# Patient Record
Sex: Female | Born: 1975 | Race: White | Hispanic: No | Marital: Married | Smoking: Former smoker
Health system: Southern US, Community
[De-identification: ages and names within clinical notes are randomized; demographics above are authoritative.]

## PROBLEM LIST (undated history)

## (undated) DIAGNOSIS — K219 Gastro-esophageal reflux disease without esophagitis: Secondary | ICD-10-CM

## (undated) DIAGNOSIS — F329 Major depressive disorder, single episode, unspecified: Secondary | ICD-10-CM

## (undated) DIAGNOSIS — G4730 Sleep apnea, unspecified: Secondary | ICD-10-CM

## (undated) DIAGNOSIS — F32A Depression, unspecified: Secondary | ICD-10-CM

## (undated) DIAGNOSIS — I499 Cardiac arrhythmia, unspecified: Secondary | ICD-10-CM

## (undated) DIAGNOSIS — J45909 Unspecified asthma, uncomplicated: Secondary | ICD-10-CM

## (undated) DIAGNOSIS — N809 Endometriosis, unspecified: Secondary | ICD-10-CM

## (undated) DIAGNOSIS — K7581 Nonalcoholic steatohepatitis (NASH): Secondary | ICD-10-CM

## (undated) DIAGNOSIS — G43909 Migraine, unspecified, not intractable, without status migrainosus: Secondary | ICD-10-CM

## (undated) DIAGNOSIS — M797 Fibromyalgia: Secondary | ICD-10-CM

## (undated) DIAGNOSIS — E7800 Pure hypercholesterolemia, unspecified: Secondary | ICD-10-CM

## (undated) DIAGNOSIS — I10 Essential (primary) hypertension: Secondary | ICD-10-CM

## (undated) HISTORY — PX: GASTRIC BYPASS: SHX52

---

## 2007-08-31 HISTORY — PX: BREAST BIOPSY: SHX20

## 2007-08-31 HISTORY — PX: BREAST EXCISIONAL BIOPSY: SUR124

## 2010-08-30 HISTORY — PX: TOTAL ABDOMINAL HYSTERECTOMY: SHX209

## 2010-09-04 ENCOUNTER — Ambulatory Visit: Payer: Self-pay | Admitting: Internal Medicine

## 2010-09-28 ENCOUNTER — Ambulatory Visit: Payer: Self-pay | Admitting: Oncology

## 2010-09-28 ENCOUNTER — Ambulatory Visit: Payer: Self-pay | Admitting: Internal Medicine

## 2010-09-30 ENCOUNTER — Ambulatory Visit: Payer: Self-pay | Admitting: Internal Medicine

## 2010-10-29 ENCOUNTER — Ambulatory Visit: Payer: Self-pay | Admitting: Internal Medicine

## 2010-11-10 ENCOUNTER — Emergency Department: Payer: Self-pay | Admitting: Unknown Physician Specialty

## 2010-11-26 ENCOUNTER — Ambulatory Visit: Payer: Self-pay | Admitting: Obstetrics & Gynecology

## 2010-12-08 ENCOUNTER — Ambulatory Visit: Payer: Self-pay | Admitting: Obstetrics & Gynecology

## 2011-01-20 ENCOUNTER — Ambulatory Visit: Payer: Self-pay | Admitting: Otolaryngology

## 2011-06-29 ENCOUNTER — Ambulatory Visit: Payer: Self-pay | Admitting: Internal Medicine

## 2011-08-25 ENCOUNTER — Ambulatory Visit: Payer: Self-pay | Admitting: Internal Medicine

## 2011-08-25 ENCOUNTER — Ambulatory Visit: Payer: Self-pay | Admitting: Gastroenterology

## 2011-10-21 ENCOUNTER — Ambulatory Visit: Payer: Self-pay | Admitting: Internal Medicine

## 2011-10-21 LAB — CBC CANCER CENTER
Basophil #: 0.1 x10 3/mm (ref 0.0–0.1)
Eosinophil #: 0.3 x10 3/mm (ref 0.0–0.7)
HCT: 44.8 % (ref 35.0–47.0)
Lymphocyte #: 4.9 x10 3/mm — ABNORMAL HIGH (ref 1.0–3.6)
Lymphocyte %: 38.5 %
MCHC: 33.1 g/dL (ref 32.0–36.0)
MCV: 91.4 fL (ref 80–100)
Monocyte %: 5 %
Neutrophil %: 53.3 %
RBC: 4.9 10*6/uL (ref 3.80–5.20)
RDW: 12.9 % (ref 11.5–14.5)

## 2011-10-29 ENCOUNTER — Ambulatory Visit: Payer: Self-pay | Admitting: Internal Medicine

## 2012-01-28 ENCOUNTER — Ambulatory Visit: Payer: Self-pay | Admitting: Gastroenterology

## 2012-01-28 LAB — CREATININE, SERUM
EGFR (African American): >60
EGFR (Non-African Amer.): >60

## 2012-03-06 ENCOUNTER — Ambulatory Visit: Payer: Self-pay | Admitting: Emergency Medicine

## 2012-03-09 LAB — BETA STREP CULTURE(ARMC)

## 2012-04-13 ENCOUNTER — Ambulatory Visit: Payer: Self-pay

## 2012-04-15 ENCOUNTER — Ambulatory Visit: Payer: Self-pay

## 2012-04-15 LAB — BASIC METABOLIC PANEL
Anion Gap: 9 (ref 7–16)
Calcium, Total: 9.2 mg/dL (ref 8.5–10.1)
Co2: 27 mmol/L (ref 21–32)
Creatinine: 0.77 mg/dL (ref 0.60–1.30)
EGFR (African American): >60
Osmolality: 273 (ref 275–301)
Sodium: 138 mmol/L (ref 136–145)

## 2012-04-15 LAB — CBC WITH DIFFERENTIAL/PLATELET
Basophil #: 0.1 10*3/uL (ref 0.0–0.1)
Basophil %: 0.9 %
Eosinophil %: 6.3 %
HCT: 43.3 % (ref 35.0–47.0)
HGB: 14.4 g/dL (ref 12.0–16.0)
Lymphocyte #: 3.3 10*3/uL (ref 1.0–3.6)
Lymphocyte %: 34.2 %
MCH: 30.8 pg (ref 26.0–34.0)
MCV: 92 fL (ref 80–100)
Monocyte #: 1 x10 3/mm — ABNORMAL HIGH (ref 0.2–0.9)
Neutrophil #: 4.6 10*3/uL (ref 1.4–6.5)
Platelet: 296 10*3/uL (ref 150–440)
RDW: 13.3 % (ref 11.5–14.5)
WBC: 9.6 10*3/uL (ref 3.6–11.0)

## 2012-07-05 ENCOUNTER — Ambulatory Visit: Payer: Self-pay | Admitting: Pain Medicine

## 2012-07-05 LAB — SEDIMENTATION RATE: Erythrocyte Sed Rate: 18 mm/hr (ref 0–20)

## 2012-07-11 ENCOUNTER — Ambulatory Visit: Payer: Self-pay | Admitting: Pain Medicine

## 2013-01-11 ENCOUNTER — Emergency Department: Payer: Self-pay | Admitting: Emergency Medicine

## 2013-01-11 LAB — CBC
HCT: 40.2 % (ref 35.0–47.0)
MCH: 29.8 pg (ref 26.0–34.0)
MCHC: 33.6 g/dL (ref 32.0–36.0)
MCV: 89 fL (ref 80–100)
RBC: 4.53 10*6/uL (ref 3.80–5.20)
RDW: 13.7 % (ref 11.5–14.5)
WBC: 13.8 10*3/uL — ABNORMAL HIGH (ref 3.6–11.0)

## 2013-01-11 LAB — COMPREHENSIVE METABOLIC PANEL
Alkaline Phosphatase: 123 U/L (ref 50–136)
Anion Gap: 6 — ABNORMAL LOW (ref 7–16)
BUN: 13 mg/dL (ref 7–18)
Calcium, Total: 9.9 mg/dL (ref 8.5–10.1)
Co2: 25 mmol/L (ref 21–32)
EGFR (African American): >60
EGFR (Non-African Amer.): >60
Glucose: 113 mg/dL — ABNORMAL HIGH (ref 65–99)
Osmolality: 278 (ref 275–301)
Potassium: 3.7 mmol/L (ref 3.5–5.1)
SGOT(AST): 39 U/L — ABNORMAL HIGH (ref 15–37)
Sodium: 139 mmol/L (ref 136–145)

## 2013-04-13 DIAGNOSIS — R609 Edema, unspecified: Secondary | ICD-10-CM | POA: Insufficient documentation

## 2013-07-02 DIAGNOSIS — F419 Anxiety disorder, unspecified: Secondary | ICD-10-CM | POA: Insufficient documentation

## 2013-11-19 DIAGNOSIS — F32A Depression, unspecified: Secondary | ICD-10-CM | POA: Insufficient documentation

## 2013-11-20 DIAGNOSIS — E785 Hyperlipidemia, unspecified: Secondary | ICD-10-CM | POA: Insufficient documentation

## 2013-12-11 IMAGING — CR PELVIS - 1-2 VIEW
1 series · 1 of 1 positions shown · non-contrast
Comparison: none

REASON FOR EXAM: myalgias and arthraigias neck pain upper back pain
lumbar radiculitis hip pain
COMMENTS:  knee pain

PROCEDURE:     DXR - DXR PELVIS AP ONLY  - July 05, 2012 [DATE]
RESULT:     Single view the pelvis demonstrates no fracture, dislocation or
radiopaque foreign body. Multiple phleboliths appear present over the
bladder region.

[t pelvis ap]
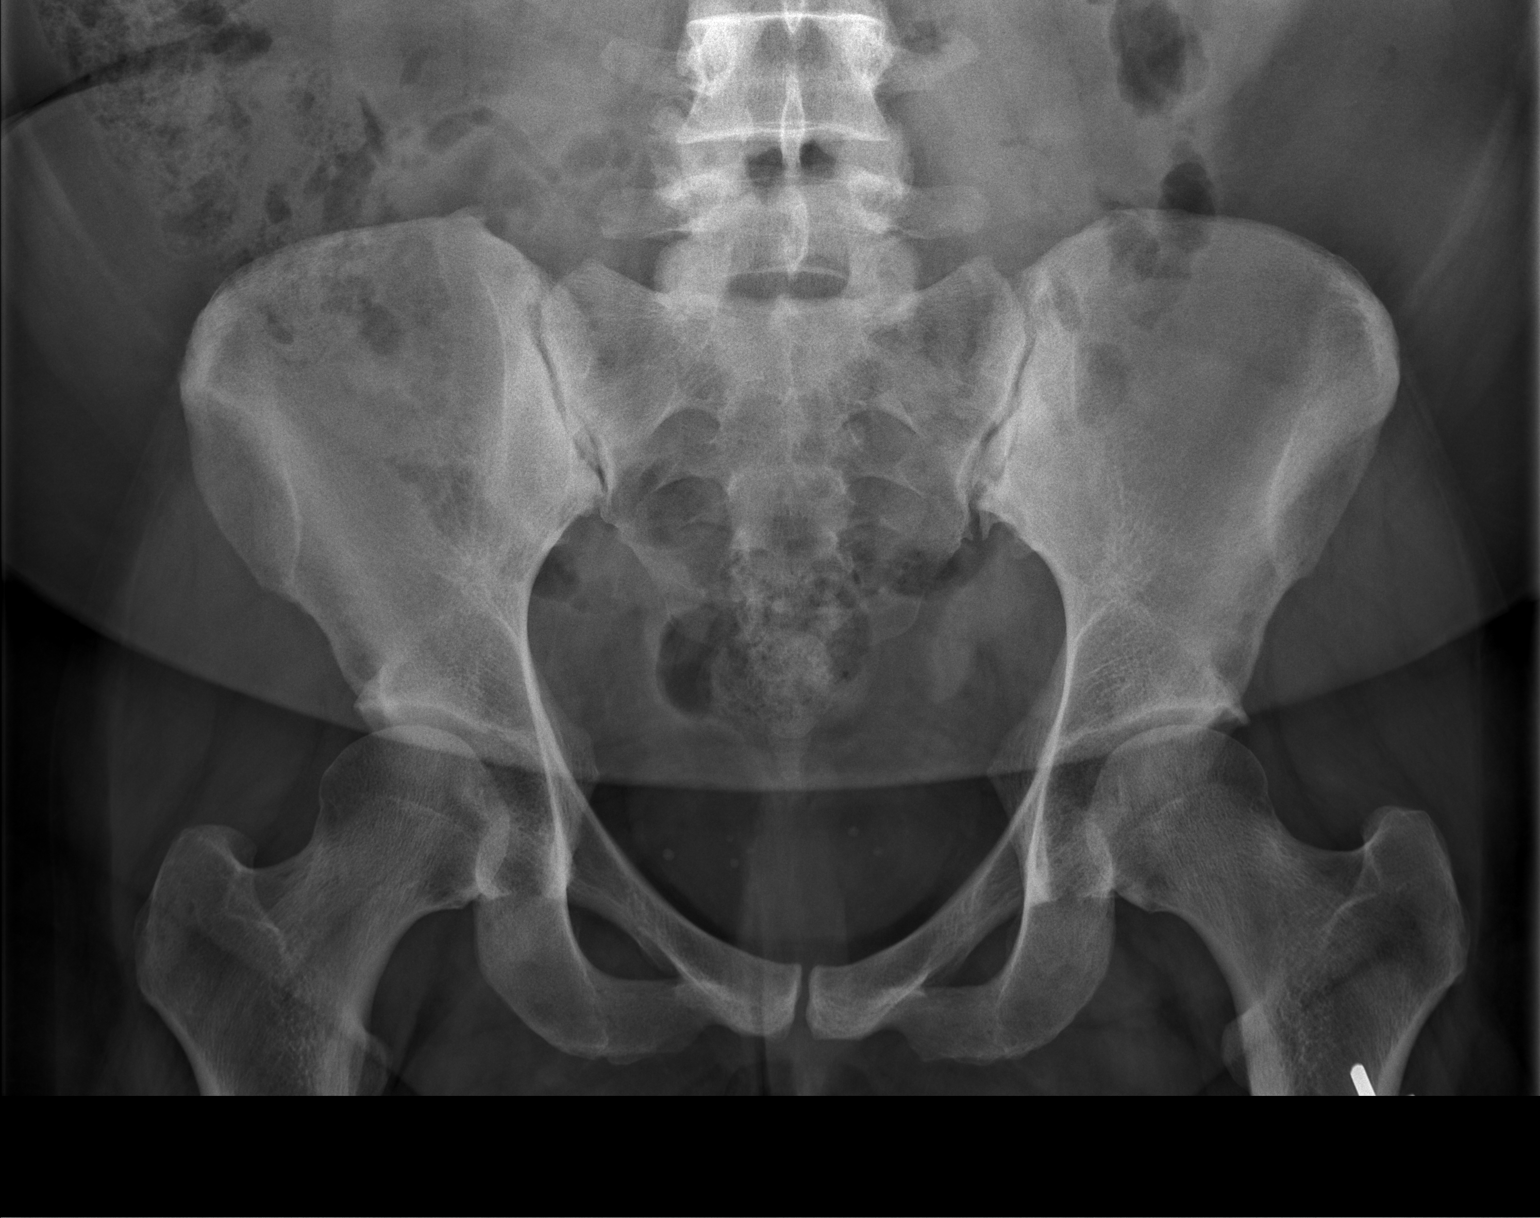

[1 of 1 positions shown; findings below may reference images not displayed]

IMPRESSION: 1. No acute bony abnormality evident.

[REDACTED]

## 2013-12-11 IMAGING — CR DG THORACIC SPINE 2-3V
1 series · 2 of 2 positions shown · non-contrast
Comparison: none

REASON FOR EXAM: myalgias and arthraigias neck pain upper back pain
lumbar radiculitis hip pain
COMMENTS:  knee pain

PROCEDURE:     DXR - DXR THORACIC  AP AND LATERAL  - July 05, 2012 [DATE]
RESULT:     Thoracic spine AP and lateral projection images show
preservation of the vertebral body heights and spinal alignment. The disc
spaces appear to be unremarkable.

[Series 1: t thoracic spine ap · 0.14mm/px · 2 of 2 slices shown]
[im 1/2]
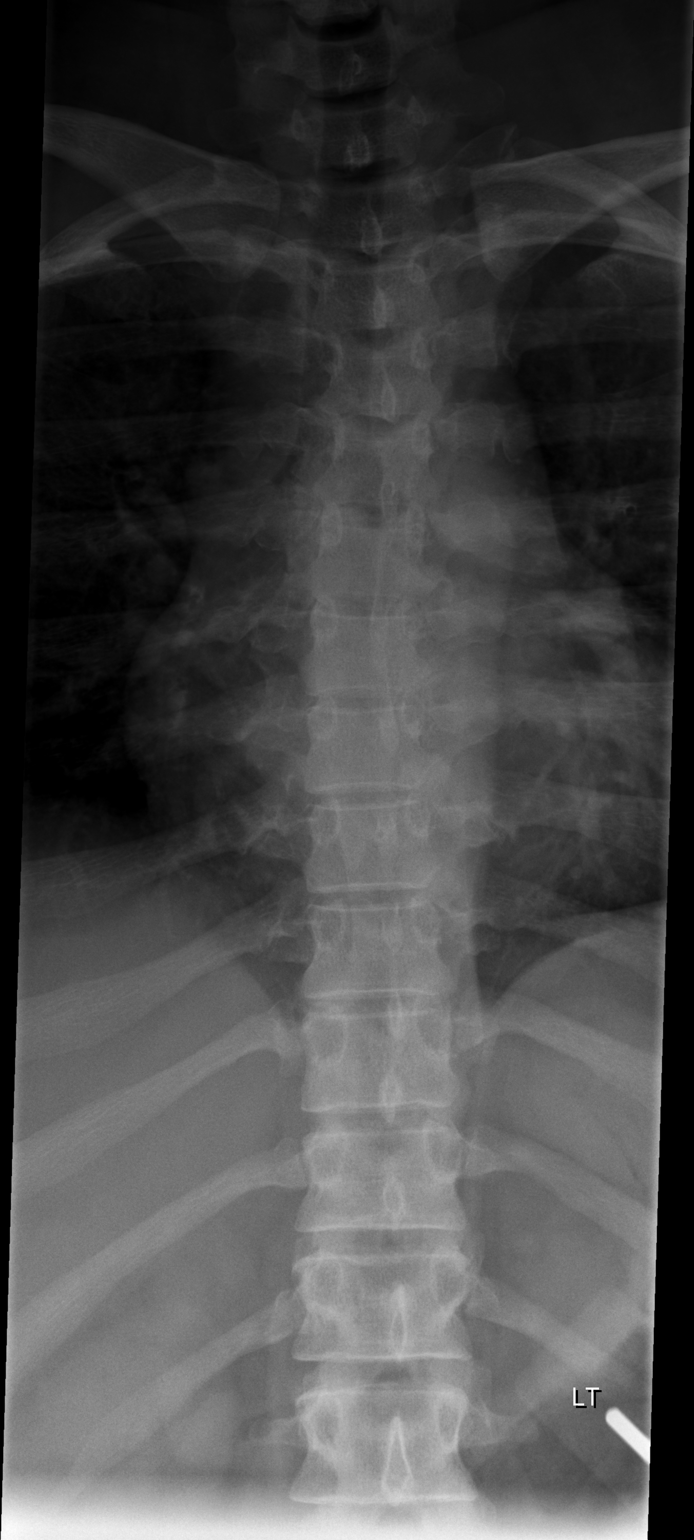
[im 2/2]
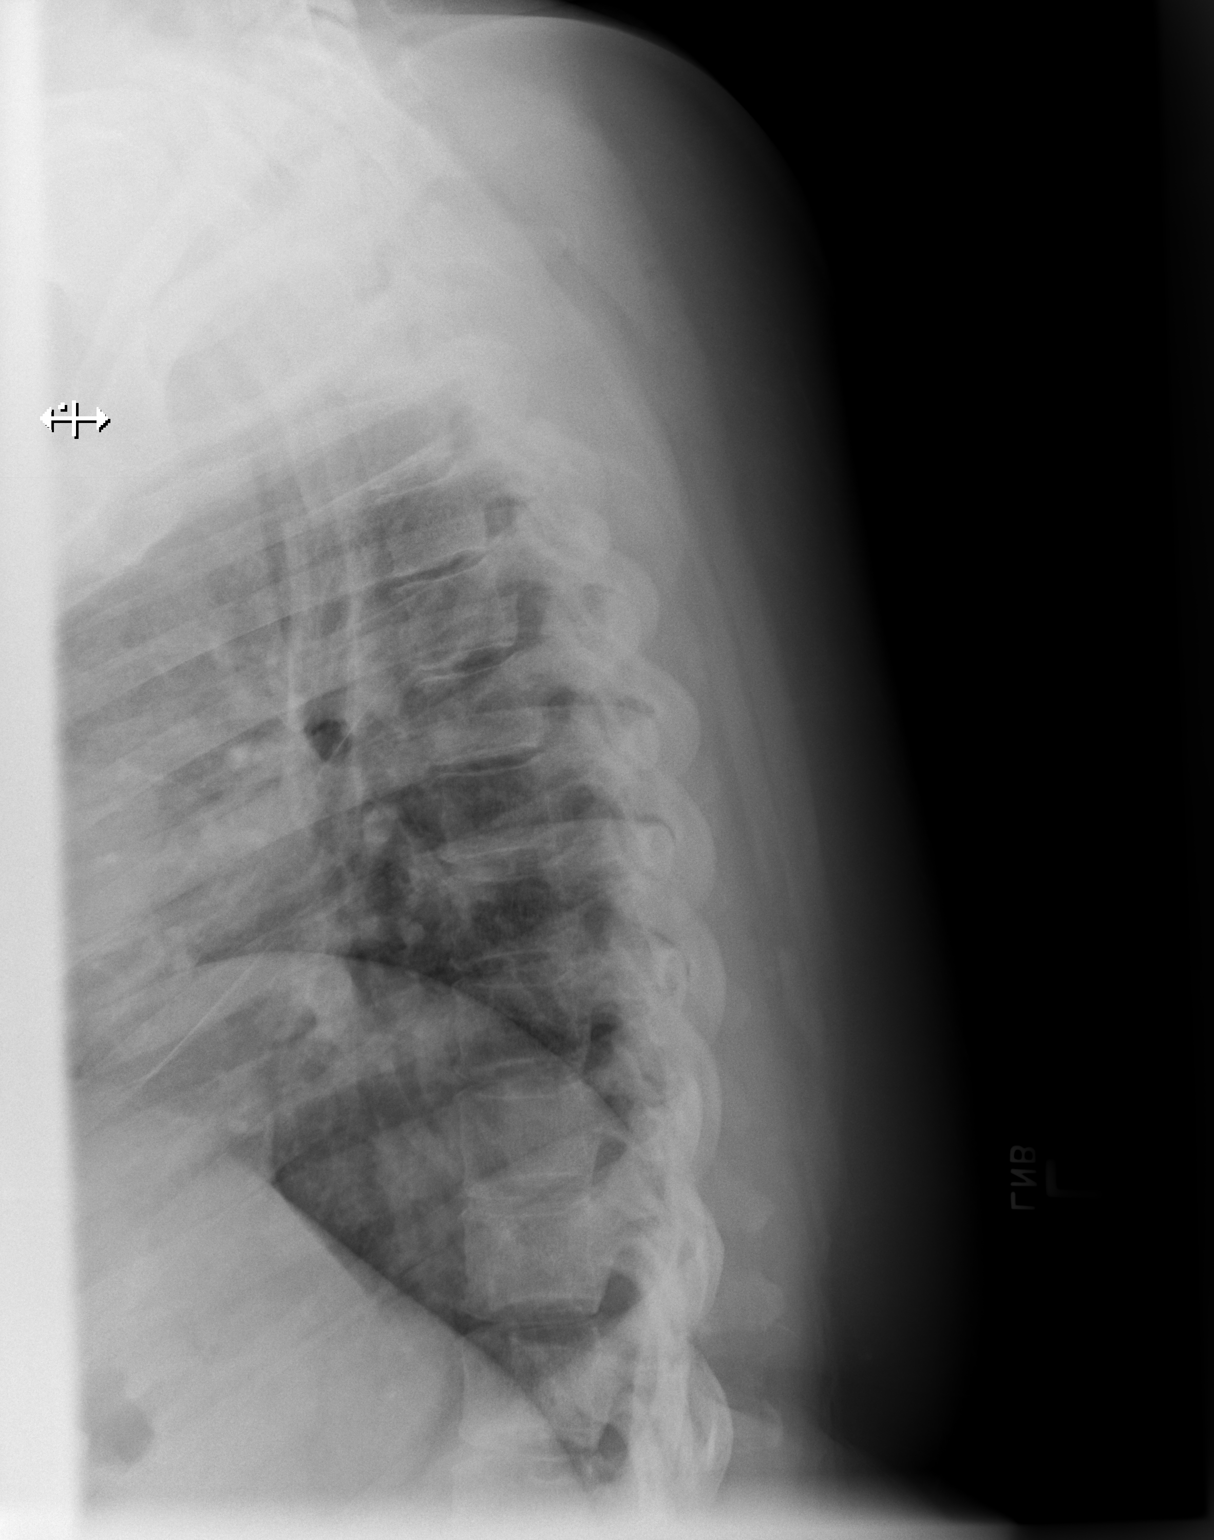

[2 of 2 positions shown; findings below may reference images not displayed]

IMPRESSION: No acute thoracic spine bony abnormality.

[REDACTED]

## 2013-12-11 IMAGING — CR CERVICAL SPINE - COMPLETE 4+ VIEW
1 series · 6 of 6 positions shown · non-contrast
Comparison: none

REASON FOR EXAM: myalgias and arthraigias neck pain upper back pain
lumbar radiculitis hip pain
COMMENTS:  knee pain

[Series 1: w cervical spine lat · 0.14mm/px · 6 of 6 slices shown]
[im 1/6]
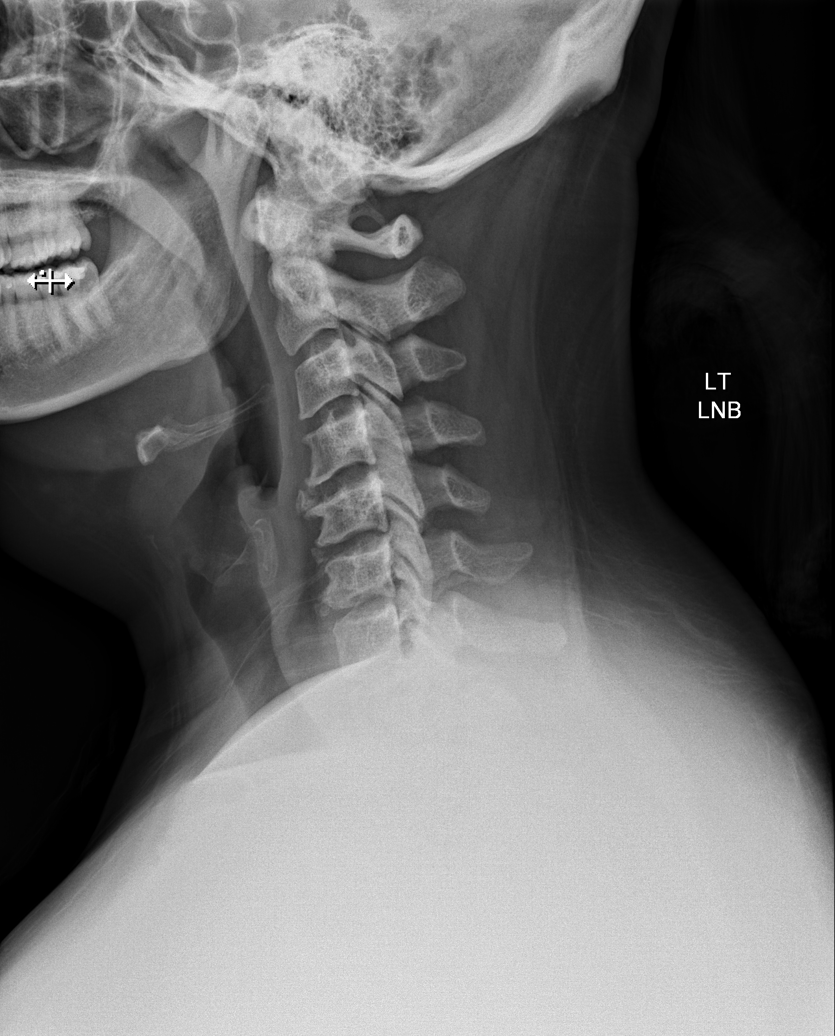
[im 2/6]
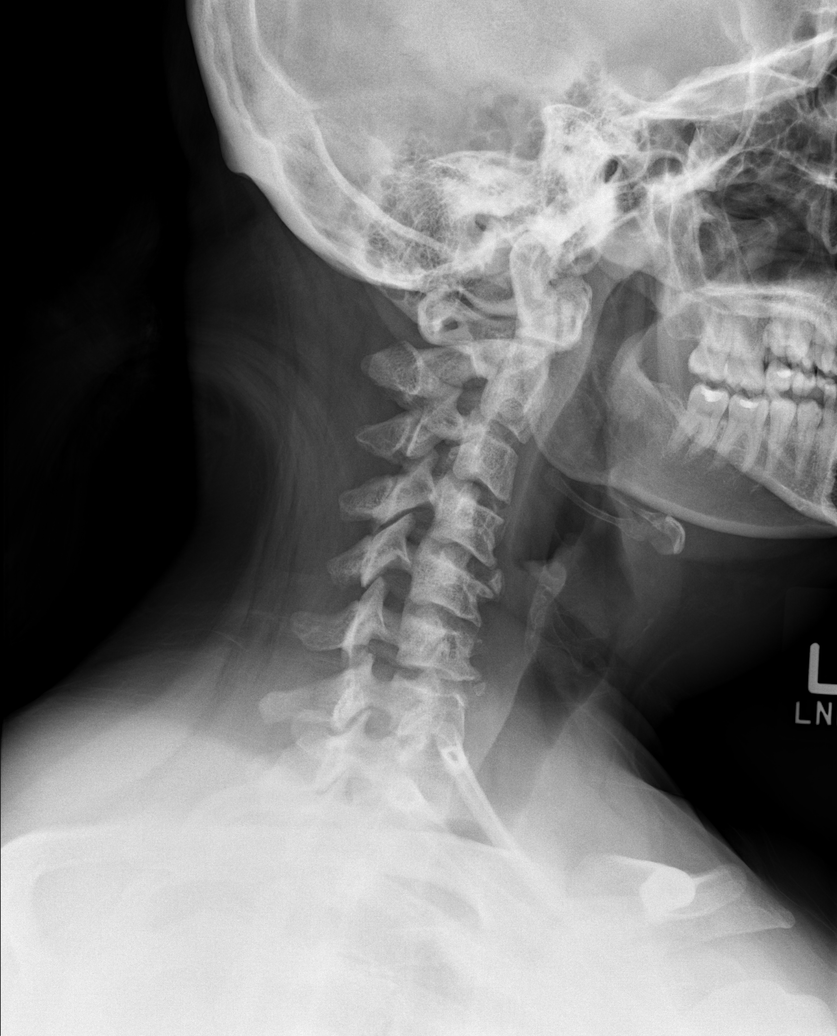
[im 3/6]
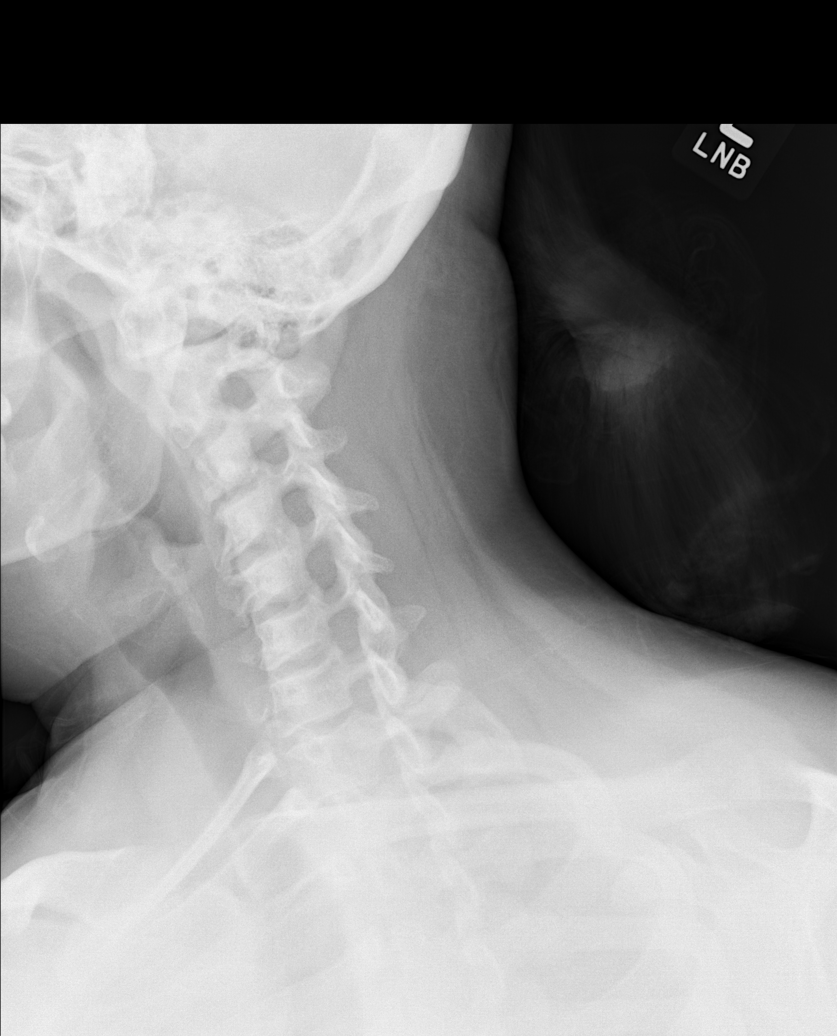
[im 4/6]
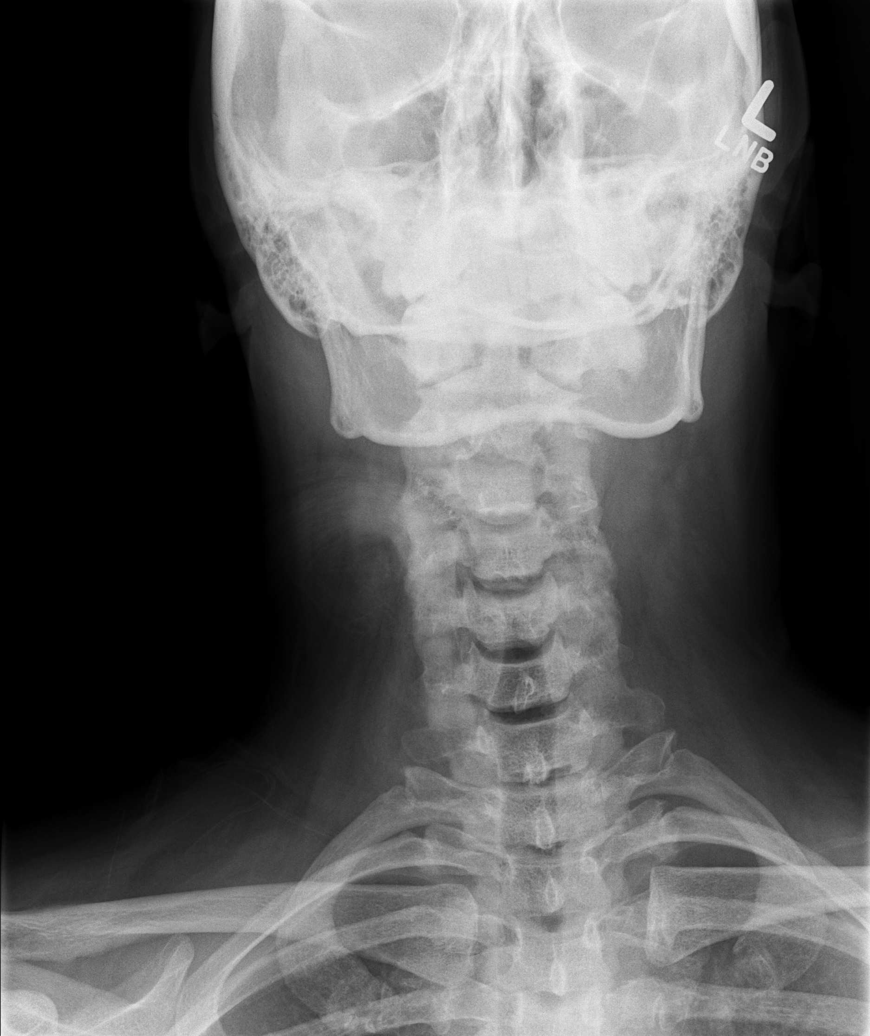
[im 5/6]
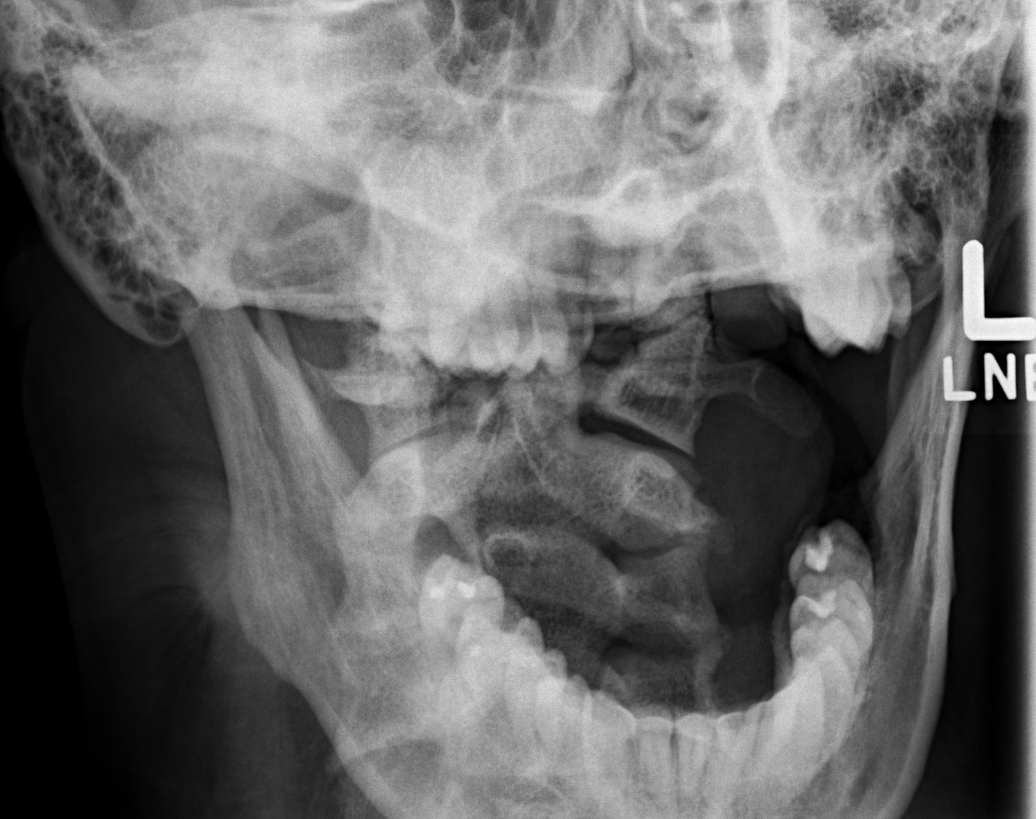
[im 6/6]
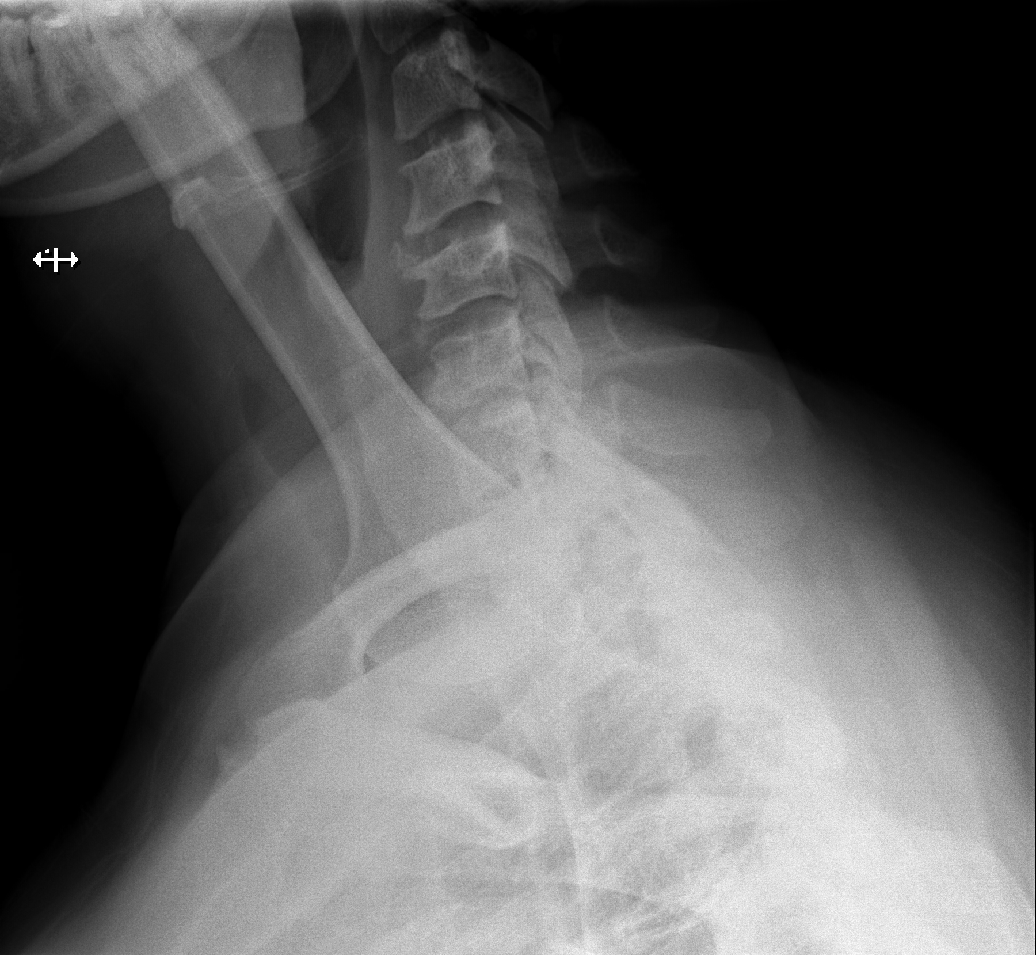

[6 of 6 positions shown; findings below may reference images not displayed]

PROCEDURE:     DXR - DXR CERVICAL SPINE COMPLETE  - July 05, 2012 [DATE]

RESULT:     There is reversal of the normal cervical lordosis. There is
multilevel degenerative disc space narrowing with hypertrophic degenerative
endplate spurring. There is no fracture or subluxation. No significant bony
encroachment on the foramina is appreciated. The odontoid is not well
demonstrated. The head is rotated toward the left. The atlantoaxial
alignment appears to be grossly normal for the projection.
IMPRESSION: Limited study with reversal of the normal cervical
lordosis. This could be secondary to muscle spasm. No fracture evident.

[REDACTED]

## 2013-12-11 IMAGING — CR DG KNEE STANDING AP BILAT
1 series · 1 of 1 positions shown · non-contrast
Comparison: none

REASON FOR EXAM: myalgias and arthraigias neck pain upper back pain
lumbar radiculitis hip pain
COMMENTS:  knee pain

PROCEDURE:     DXR - DXR BILATERAL KNEES STANDING  - July 05, 2012 [DATE]
RESULT:     AP standing views of the knees in the anterior posterior
projection show normal alignment without fracture or dislocation. No foreign
body is seen. There does not appear to be severe joint space narrowing.

[w knees ap bilat]
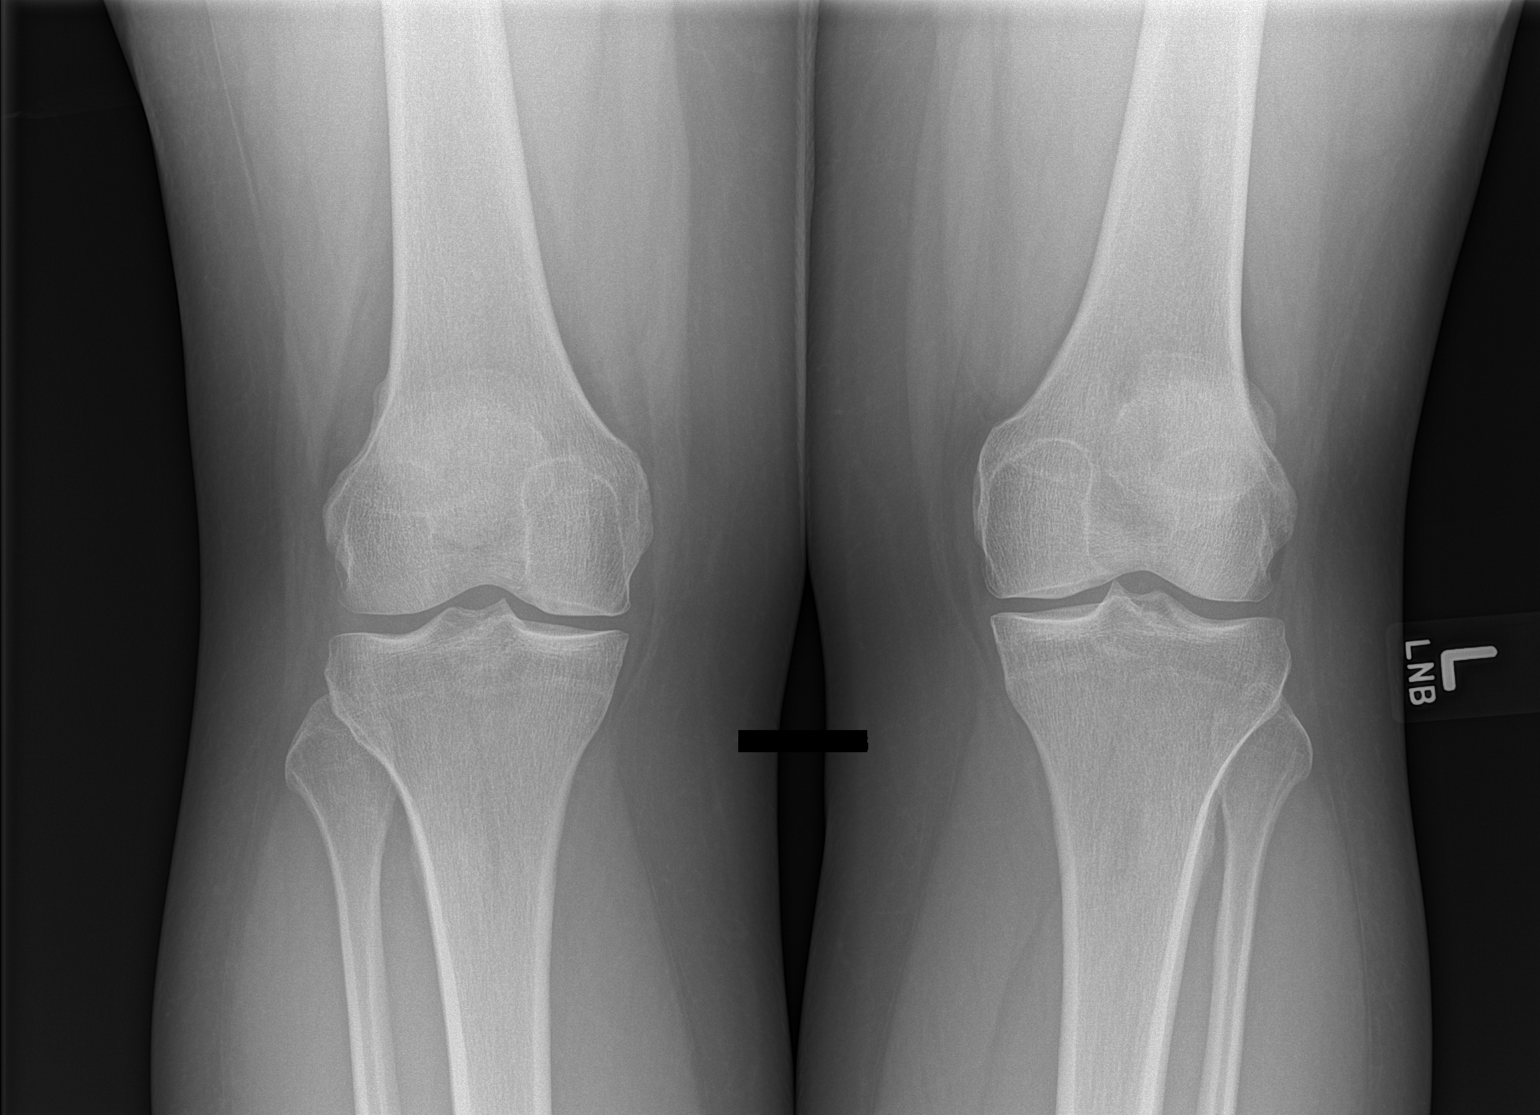

[1 of 1 positions shown; findings below may reference images not displayed]

IMPRESSION: Please see above.

[REDACTED]

## 2014-01-08 ENCOUNTER — Ambulatory Visit: Payer: Self-pay | Admitting: Emergency Medicine

## 2014-02-19 ENCOUNTER — Ambulatory Visit: Payer: Self-pay | Admitting: Internal Medicine

## 2014-02-25 ENCOUNTER — Observation Stay: Payer: Self-pay | Admitting: Surgery

## 2014-02-25 LAB — CBC WITH DIFFERENTIAL/PLATELET
Basophil #: 0.1 10*3/uL (ref 0.0–0.1)
Basophil %: 0.8 %
Eosinophil #: 0.4 10*3/uL (ref 0.0–0.7)
Eosinophil %: 2.6 %
HCT: 40.4 % (ref 35.0–47.0)
HGB: 13.4 g/dL (ref 12.0–16.0)
LYMPHS ABS: 5.3 10*3/uL — AB (ref 1.0–3.6)
Lymphocyte %: 38.4 %
MCH: 30.4 pg (ref 26.0–34.0)
MCHC: 33.1 g/dL (ref 32.0–36.0)
MCV: 92 fL (ref 80–100)
Monocyte #: 0.8 x10 3/mm (ref 0.2–0.9)
Monocyte %: 6.1 %
NEUTROS PCT: 52.1 %
Neutrophil #: 7.2 10*3/uL — ABNORMAL HIGH (ref 1.4–6.5)
Platelet: 322 10*3/uL (ref 150–440)
RBC: 4.41 10*6/uL (ref 3.80–5.20)
RDW: 13.5 % (ref 11.5–14.5)
WBC: 13.8 10*3/uL — AB (ref 3.6–11.0)

## 2014-02-25 LAB — COMPREHENSIVE METABOLIC PANEL
ALT: 44 U/L (ref 12–78)
AST: 26 U/L (ref 15–37)
Albumin: 3.3 g/dL — ABNORMAL LOW (ref 3.4–5.0)
Alkaline Phosphatase: 77 U/L
Anion Gap: 5 — ABNORMAL LOW (ref 7–16)
BUN: 15 mg/dL (ref 7–18)
Bilirubin,Total: 0.2 mg/dL (ref 0.2–1.0)
CREATININE: 1.22 mg/dL (ref 0.60–1.30)
Calcium, Total: 9.5 mg/dL (ref 8.5–10.1)
Chloride: 102 mmol/L (ref 98–107)
Co2: 33 mmol/L — ABNORMAL HIGH (ref 21–32)
EGFR (Non-African Amer.): 57 — ABNORMAL LOW
Glucose: 84 mg/dL (ref 65–99)
Osmolality: 279 (ref 275–301)
Potassium: 3.4 mmol/L — ABNORMAL LOW (ref 3.5–5.1)
Sodium: 140 mmol/L (ref 136–145)
Total Protein: 7.4 g/dL (ref 6.4–8.2)

## 2014-02-26 LAB — COMPREHENSIVE METABOLIC PANEL
Albumin: 3 g/dL — ABNORMAL LOW (ref 3.4–5.0)
Alkaline Phosphatase: 62 U/L
Anion Gap: 9 (ref 7–16)
BUN: 13 mg/dL (ref 7–18)
Bilirubin,Total: 0.3 mg/dL (ref 0.2–1.0)
Calcium, Total: 8.8 mg/dL (ref 8.5–10.1)
Chloride: 104 mmol/L (ref 98–107)
Co2: 30 mmol/L (ref 21–32)
Creatinine: 1.07 mg/dL (ref 0.60–1.30)
EGFR (African American): >60
Glucose: 97 mg/dL (ref 65–99)
Osmolality: 285 (ref 275–301)
POTASSIUM: 3.5 mmol/L (ref 3.5–5.1)
SGOT(AST): 24 U/L (ref 15–37)
SGPT (ALT): 45 U/L (ref 12–78)
SODIUM: 143 mmol/L (ref 136–145)
Total Protein: 6.5 g/dL (ref 6.4–8.2)

## 2014-02-26 LAB — CBC WITH DIFFERENTIAL/PLATELET
BASOS ABS: 0.1 10*3/uL (ref 0.0–0.1)
BASOS PCT: 0.7 %
EOS PCT: 4 %
Eosinophil #: 0.5 10*3/uL (ref 0.0–0.7)
HCT: 37.5 % (ref 35.0–47.0)
HGB: 11.9 g/dL — ABNORMAL LOW (ref 12.0–16.0)
Lymphocyte #: 5.1 10*3/uL — ABNORMAL HIGH (ref 1.0–3.6)
Lymphocyte %: 43.3 %
MCH: 29.6 pg (ref 26.0–34.0)
MCHC: 31.8 g/dL — ABNORMAL LOW (ref 32.0–36.0)
MCV: 93 fL (ref 80–100)
MONO ABS: 0.7 x10 3/mm (ref 0.2–0.9)
MONOS PCT: 5.8 %
NEUTROS ABS: 5.4 10*3/uL (ref 1.4–6.5)
NEUTROS PCT: 46.2 %
Platelet: 303 10*3/uL (ref 150–440)
RBC: 4.03 10*6/uL (ref 3.80–5.20)
RDW: 13.4 % (ref 11.5–14.5)
WBC: 11.7 10*3/uL — ABNORMAL HIGH (ref 3.6–11.0)

## 2014-02-27 LAB — CBC WITH DIFFERENTIAL/PLATELET
Basophil #: 0.2 10*3/uL — ABNORMAL HIGH (ref 0.0–0.1)
Basophil %: 0.9 %
Eosinophil #: 0.1 10*3/uL (ref 0.0–0.7)
Eosinophil %: 0.3 %
HCT: 36.5 % (ref 35.0–47.0)
HGB: 12.2 g/dL (ref 12.0–16.0)
LYMPHS PCT: 17.1 %
Lymphocyte #: 3.2 10*3/uL (ref 1.0–3.6)
MCH: 30.5 pg (ref 26.0–34.0)
MCHC: 33.5 g/dL (ref 32.0–36.0)
MCV: 91 fL (ref 80–100)
MONO ABS: 0.9 x10 3/mm (ref 0.2–0.9)
Monocyte %: 4.6 %
Neutrophil #: 14.6 10*3/uL — ABNORMAL HIGH (ref 1.4–6.5)
Neutrophil %: 77.1 %
Platelet: 301 10*3/uL (ref 150–440)
RBC: 4.01 10*6/uL (ref 3.80–5.20)
RDW: 13.3 % (ref 11.5–14.5)
WBC: 18.9 10*3/uL — ABNORMAL HIGH (ref 3.6–11.0)

## 2014-02-27 LAB — COMPREHENSIVE METABOLIC PANEL
ALBUMIN: 3.2 g/dL — AB (ref 3.4–5.0)
ALT: 76 U/L (ref 12–78)
AST: 42 U/L — AB (ref 15–37)
Alkaline Phosphatase: 63 U/L
Anion Gap: 7 (ref 7–16)
BUN: 11 mg/dL (ref 7–18)
Bilirubin,Total: 0.4 mg/dL (ref 0.2–1.0)
CHLORIDE: 103 mmol/L (ref 98–107)
CO2: 29 mmol/L (ref 21–32)
CREATININE: 1.01 mg/dL (ref 0.60–1.30)
Calcium, Total: 9.1 mg/dL (ref 8.5–10.1)
EGFR (African American): >60
GLUCOSE: 111 mg/dL — AB (ref 65–99)
Osmolality: 278 (ref 275–301)
Potassium: 3.9 mmol/L (ref 3.5–5.1)
Sodium: 139 mmol/L (ref 136–145)
Total Protein: 6.9 g/dL (ref 6.4–8.2)

## 2014-02-28 LAB — PATHOLOGY REPORT

## 2014-03-06 ENCOUNTER — Other Ambulatory Visit: Payer: Self-pay | Admitting: Surgery

## 2014-03-06 LAB — CBC WITH DIFFERENTIAL/PLATELET
BASOS ABS: 0.1 10*3/uL (ref 0.0–0.1)
BASOS PCT: 0.9 %
Eosinophil #: 1.1 10*3/uL — ABNORMAL HIGH (ref 0.0–0.7)
Eosinophil %: 8.2 %
HCT: 41.9 % (ref 35.0–47.0)
HGB: 13.6 g/dL (ref 12.0–16.0)
LYMPHS PCT: 27.3 %
Lymphocyte #: 3.8 10*3/uL — ABNORMAL HIGH (ref 1.0–3.6)
MCH: 29.8 pg (ref 26.0–34.0)
MCHC: 32.3 g/dL (ref 32.0–36.0)
MCV: 92 fL (ref 80–100)
Monocyte #: 0.7 x10 3/mm (ref 0.2–0.9)
Monocyte %: 4.8 %
NEUTROS PCT: 58.8 %
Neutrophil #: 8.2 10*3/uL — ABNORMAL HIGH (ref 1.4–6.5)
Platelet: 330 10*3/uL (ref 150–440)
RBC: 4.55 10*6/uL (ref 3.80–5.20)
RDW: 13.3 % (ref 11.5–14.5)
WBC: 13.9 10*3/uL — AB (ref 3.6–11.0)

## 2014-03-06 LAB — COMPREHENSIVE METABOLIC PANEL
ALT: 53 U/L (ref 12–78)
Albumin: 3.4 g/dL (ref 3.4–5.0)
Alkaline Phosphatase: 83 U/L
Anion Gap: 8 (ref 7–16)
BILIRUBIN TOTAL: 0.3 mg/dL (ref 0.2–1.0)
BUN: 17 mg/dL (ref 7–18)
CALCIUM: 9.3 mg/dL (ref 8.5–10.1)
Chloride: 102 mmol/L (ref 98–107)
Co2: 29 mmol/L (ref 21–32)
Creatinine: 1.23 mg/dL (ref 0.60–1.30)
EGFR (Non-African Amer.): 57 — ABNORMAL LOW
Glucose: 99 mg/dL (ref 65–99)
OSMOLALITY: 279 (ref 275–301)
Potassium: 3.7 mmol/L (ref 3.5–5.1)
SGOT(AST): 19 U/L (ref 15–37)
Sodium: 139 mmol/L (ref 136–145)
Total Protein: 7.8 g/dL (ref 6.4–8.2)

## 2014-05-23 ENCOUNTER — Ambulatory Visit: Payer: Self-pay | Admitting: Internal Medicine

## 2014-05-30 ENCOUNTER — Ambulatory Visit: Payer: Self-pay | Admitting: Internal Medicine

## 2014-05-31 LAB — CBC CANCER CENTER
BANDS NEUTROPHIL: 2 %
Eosinophil: 4 %
HCT: 42.2 % (ref 35.0–47.0)
HGB: 13.8 g/dL (ref 12.0–16.0)
Lymphocytes: 35 %
MCH: 30.7 pg (ref 26.0–34.0)
MCHC: 32.6 g/dL (ref 32.0–36.0)
MCV: 94 fL (ref 80–100)
Monocytes: 2 %
Platelet: 348 x10 3/mm (ref 150–440)
RBC: 4.49 10*6/uL (ref 3.80–5.20)
RDW: 14.3 % (ref 11.5–14.5)
Segmented Neutrophils: 56 %
Variant Lymphocyte: 1 %
WBC: 15 x10 3/mm — ABNORMAL HIGH (ref 3.6–11.0)

## 2014-06-30 ENCOUNTER — Ambulatory Visit: Payer: Self-pay | Admitting: Internal Medicine

## 2014-10-03 ENCOUNTER — Ambulatory Visit: Payer: Self-pay | Admitting: Internal Medicine

## 2014-10-31 ENCOUNTER — Ambulatory Visit: Payer: Self-pay | Admitting: Internal Medicine

## 2014-12-07 ENCOUNTER — Ambulatory Visit
Admit: 2014-12-07 | Disposition: A | Discharge status: Home / Self Care | Payer: Self-pay | Attending: Family Medicine | Admitting: Family Medicine

## 2014-12-08 ENCOUNTER — Ambulatory Visit
Admit: 2014-12-08 | Disposition: A | Discharge status: Home / Self Care | Payer: Self-pay | Attending: Family Medicine | Admitting: Family Medicine

## 2014-12-08 LAB — RAPID STREP-A WITH REFLX: MICRO TEXT REPORT: NEGATIVE

## 2014-12-10 LAB — BETA STREP CULTURE(ARMC)

## 2014-12-21 NOTE — Op Note (Signed)
PATIENT NAME:  Norma Riggs, NASH MR#:  [id number] DATE OF BIRTH:  Nov 24, 1975  DATE OF PROCEDURE:  02/26/2014  PREOPERATIVE DIAGNOSIS: Symptomatic cholelithiasis and acute cholecystitis.   POSTOPERATIVE DIAGNOSIS: Symptomatic cholelithiasis and acute cholecystitis.   PROCEDURE: Laparoscopic cholecystectomy.   SURGEON: Phoebe Perch, M.D.   ASSISTANT: Silvestre Moment, PA-S  INDICATIONS: This is a patient with unrelenting right upper quadrant pain and work-up previously showing gallstones. Liver function tests are normal. There is no sign of choledocholithiasis.   Preoperatively, we discussed rationale for surgery, the options of observation, risk of bleeding, infection, recurrence of symptoms, failure to resolve her symptoms, open procedure, bile duct damage, bile duct leak, and retained common bile duct stone any of which could require further surgery and/or ERCP, stent, and papillotomy. This was reviewed for her in the preop area as well. She understood and agreed to proceed.   FINDINGS: Acute cholecystitis, small gallstones, normal cystic duct.   DESCRIPTION OF PROCEDURE: The patient was induced to general anesthesia. She was on IV antibiotics. VTE prophylaxis was in place. She was prepped and draped in sterile fashion. Marcaine was infiltrated in the skin and subcutaneous tissues around the periumbilical area. Incision was made. Veress needle was placed. Pneumoperitoneum was obtained, and the abdominal cavity was explored. Under direct vision, a 10 mm epigastric port and two lateral 5 mm ports were placed adjunct to a 5 mm port that had been placed in the periumbilical area. The gallbladder was placed on tension. The peritoneum over the infundibulum was incised bluntly. The cystic duct gallbladder junction was well identified. The cystic artery was identified, doubly clipped and divided in 2 branches. This allowed for good visualization of the cystic duct as it entered the infundibulum. Here it was  doubly clipped and divided and the gallbladder was taken from the gallbladder fossa with electrocautery and passed out through the epigastric port site with the aid of an Endo Catch bag. The area was checked for hemostasis and irrigated with copious amounts of normal saline. There was no sign of bleeding, bile leak or bowel injury. The camera was placed in the epigastric site to view back to the periumbilical site and there was no sign of adhesions or bowel injury. Therefore, pneumoperitoneum was released. All ports were removed. Fascial edges at the epigastric site were approximated with figure-of-eight 0 Vicryls; 4-0 subcuticular Monocryl was used on all skin edges. Steri-Strips, Mastisol and sterile dressings were placed.   The patient tolerated the procedure well. There were no complications. She was taken to the recovery room in stable condition to be admitted for continued care.   ____________________________ Jerrol Banana. Burt Knack, MD rec:sb D: 02/26/2014 10:51:28 ET T: 02/26/2014 16:31:40 ET JOB#: [id number]  cc: Jerrol Banana. Burt Knack, MD, <Dictator> Florene Glen MD ELECTRONICALLY SIGNED 02/26/2014 18:47

## 2014-12-21 NOTE — Discharge Summary (Signed)
PATIENT NAME:  Norma Riggs, Norma Riggs MR#:  [id number] DATE OF BIRTH:  01-30-1976  DATE OF ADMISSION:  02/25/2014 DATE OF DISCHARGE:  02/27/2014  DIAGNOSES:  1. Acute cholecystitis.  2. Cholelithiasis.  3. Hypertension.   PROCEDURE: Laparoscopic cholecystectomy.   CONSULTANTS: None.   HISTORY OF PRESENT ILLNESS AND HOSPITAL COURSE: This is a patient admitted from the office by Dr. Marina Gravel with a diagnosis of acute cholecystitis. She was taken to the operating room where laparoscopic cholecystectomy was performed. She made an uncomplicated postoperative recovery, was discharged in stable condition to follow up in my office in 10 days.   ____________________________ Jerrol Banana Burt Knack, MD rec:dd D: 02/28/2014 16:31:36 ET T: 03/01/2014 03:03:07 ET JOB#: [id number]  cc: Jerrol Banana. Burt Knack, MD, <Dictator> Florene Glen MD ELECTRONICALLY SIGNED 03/01/2014 10:43

## 2014-12-22 ENCOUNTER — Emergency Department
Admit: 2014-12-22 | Disposition: A | Discharge status: Home / Self Care | Payer: Self-pay | Admitting: Emergency Medicine

## 2014-12-22 LAB — COMPREHENSIVE METABOLIC PANEL
AST: 23 U/L
Albumin: 3.4 g/dL — ABNORMAL LOW
Alkaline Phosphatase: 80 U/L
Anion Gap: 11 (ref 7–16)
BUN: 20 mg/dL
Bilirubin,Total: <0.1 mg/dL — ABNORMAL LOW
CALCIUM: 9.5 mg/dL
CHLORIDE: 101 mmol/L
Co2: 28 mmol/L
Creatinine: 1.13 mg/dL — ABNORMAL HIGH
EGFR (Non-African Amer.): >60
Glucose: 181 mg/dL — ABNORMAL HIGH
POTASSIUM: 3.2 mmol/L — AB
SGPT (ALT): 33 U/L
Sodium: 140 mmol/L
TOTAL PROTEIN: 7.5 g/dL

## 2014-12-22 LAB — CBC WITH DIFFERENTIAL/PLATELET
BASOS PCT: 0.7 %
Basophil #: 0.1 10*3/uL (ref 0.0–0.1)
EOS PCT: 3 %
Eosinophil #: 0.5 10*3/uL (ref 0.0–0.7)
HCT: 35.9 % (ref 35.0–47.0)
HGB: 11.9 g/dL — AB (ref 12.0–16.0)
LYMPHS ABS: 5.3 10*3/uL — AB (ref 1.0–3.6)
Lymphocyte %: 32 %
MCH: 29.7 pg (ref 26.0–34.0)
MCHC: 33.3 g/dL (ref 32.0–36.0)
MCV: 89 fL (ref 80–100)
MONO ABS: 0.8 x10 3/mm (ref 0.2–0.9)
MONOS PCT: 5.1 %
Neutrophil #: 9.7 10*3/uL — ABNORMAL HIGH (ref 1.4–6.5)
Neutrophil %: 59.2 %
PLATELETS: 352 10*3/uL (ref 150–440)
RBC: 4.02 10*6/uL (ref 3.80–5.20)
RDW: 13.2 % (ref 11.5–14.5)
WBC: 16.4 10*3/uL — ABNORMAL HIGH (ref 3.6–11.0)

## 2014-12-22 LAB — TROPONIN I: Troponin-I: <0.03 ng/mL

## 2014-12-23 LAB — LIPASE, BLOOD: Lipase: 45 U/L

## 2014-12-23 LAB — TROPONIN I

## 2015-07-12 ENCOUNTER — Encounter: Payer: Self-pay | Admitting: Internal Medicine

## 2015-07-14 ENCOUNTER — Other Ambulatory Visit: Payer: Self-pay | Admitting: Internal Medicine

## 2015-07-14 DIAGNOSIS — K279 Peptic ulcer, site unspecified, unspecified as acute or chronic, without hemorrhage or perforation: Secondary | ICD-10-CM | POA: Insufficient documentation

## 2015-07-14 DIAGNOSIS — D72829 Elevated white blood cell count, unspecified: Secondary | ICD-10-CM | POA: Insufficient documentation

## 2015-07-14 DIAGNOSIS — E782 Mixed hyperlipidemia: Secondary | ICD-10-CM | POA: Insufficient documentation

## 2015-07-14 DIAGNOSIS — G43919 Migraine, unspecified, intractable, without status migrainosus: Secondary | ICD-10-CM | POA: Insufficient documentation

## 2015-07-14 DIAGNOSIS — N6019 Diffuse cystic mastopathy of unspecified breast: Secondary | ICD-10-CM | POA: Insufficient documentation

## 2015-07-14 DIAGNOSIS — G479 Sleep disorder, unspecified: Secondary | ICD-10-CM | POA: Insufficient documentation

## 2015-07-14 DIAGNOSIS — G4700 Insomnia, unspecified: Secondary | ICD-10-CM | POA: Insufficient documentation

## 2015-07-14 DIAGNOSIS — K7689 Other specified diseases of liver: Secondary | ICD-10-CM | POA: Insufficient documentation

## 2015-07-14 DIAGNOSIS — R000 Tachycardia, unspecified: Secondary | ICD-10-CM | POA: Insufficient documentation

## 2015-07-14 DIAGNOSIS — F17201 Nicotine dependence, unspecified, in remission: Secondary | ICD-10-CM | POA: Insufficient documentation

## 2015-07-14 DIAGNOSIS — D3500 Benign neoplasm of unspecified adrenal gland: Secondary | ICD-10-CM | POA: Insufficient documentation

## 2015-07-14 DIAGNOSIS — Z86010 Personal history of colonic polyps: Secondary | ICD-10-CM | POA: Insufficient documentation

## 2015-07-14 DIAGNOSIS — M797 Fibromyalgia: Secondary | ICD-10-CM | POA: Insufficient documentation

## 2015-07-14 DIAGNOSIS — R7303 Prediabetes: Secondary | ICD-10-CM | POA: Insufficient documentation

## 2015-07-14 DIAGNOSIS — L732 Hidradenitis suppurativa: Secondary | ICD-10-CM | POA: Insufficient documentation

## 2015-07-14 DIAGNOSIS — R600 Localized edema: Secondary | ICD-10-CM | POA: Insufficient documentation

## 2015-07-28 IMAGING — US ABDOMEN ULTRASOUND
1 series · 14 of 25 positions shown · non-contrast
Comparison: None.

CLINICAL DATA: Abdominal pain.

EXAM:
ULTRASOUND ABDOMEN COMPLETE

[Series 1: abdomen ultrasound · 0.33mm/px · 14 of 97 slices shown]
[im 1/97]
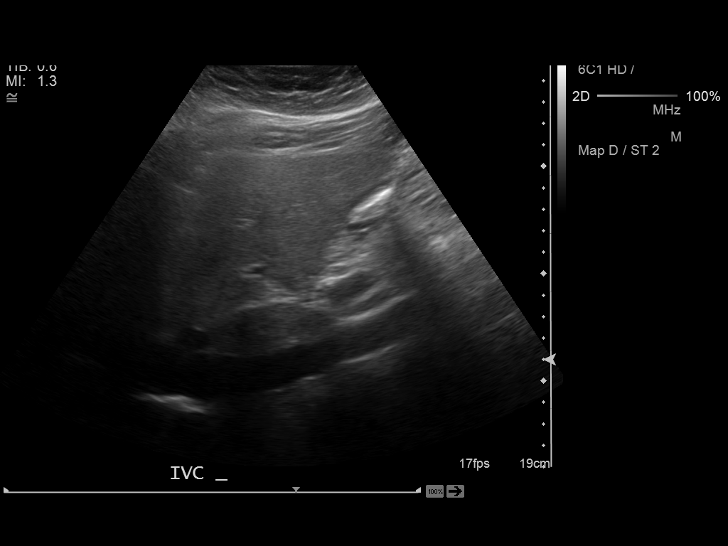
[im 9/97]
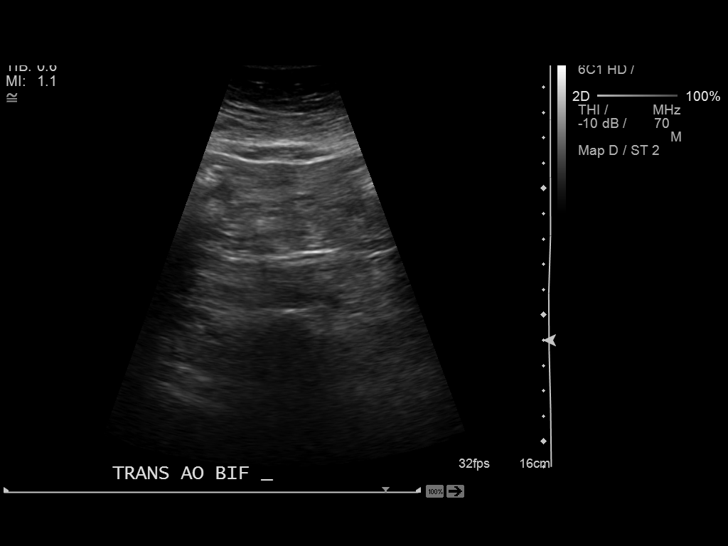
[im 17/97]
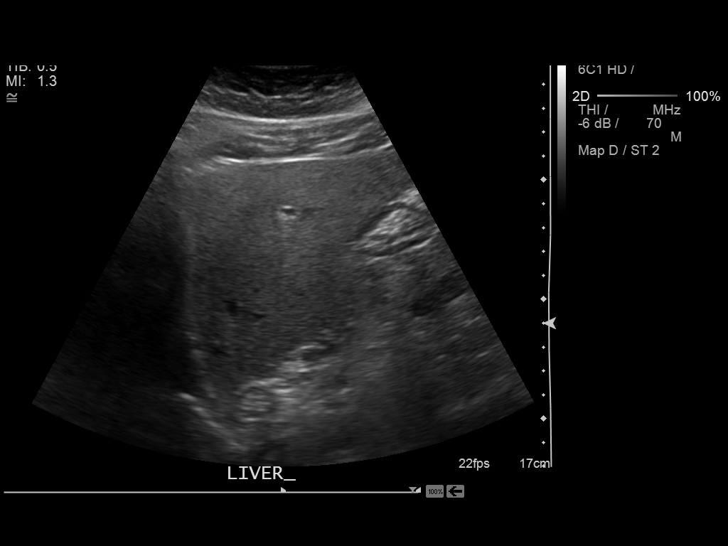
[im 25/97]
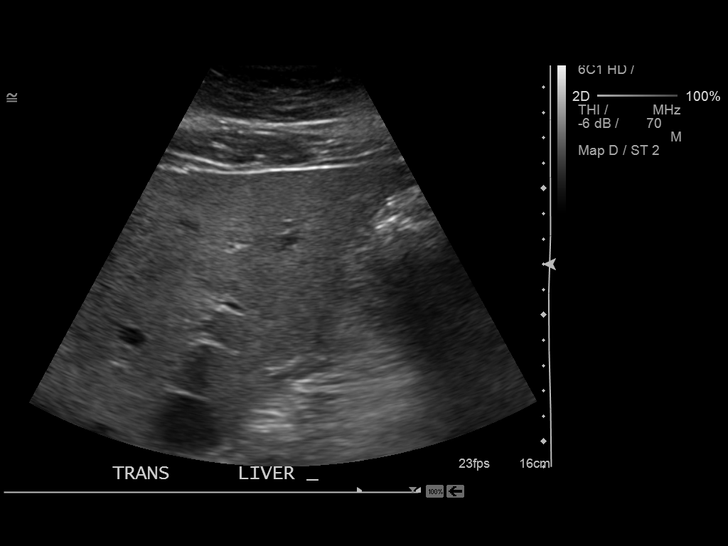
[im 33/97]
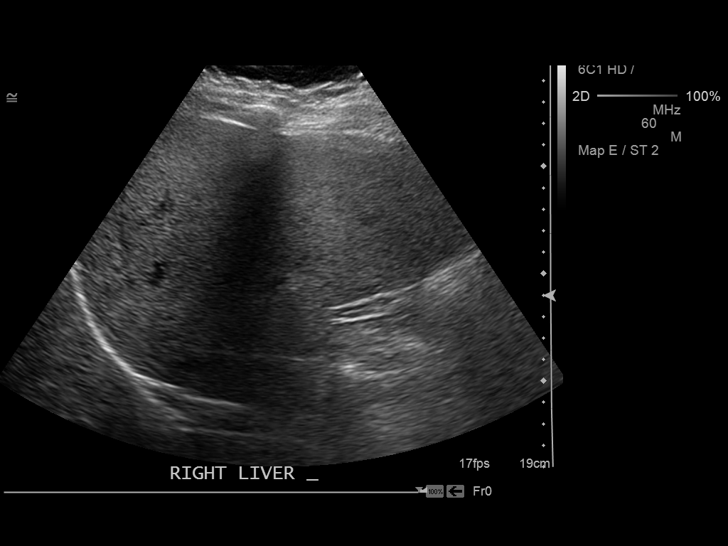
[im 37/97]
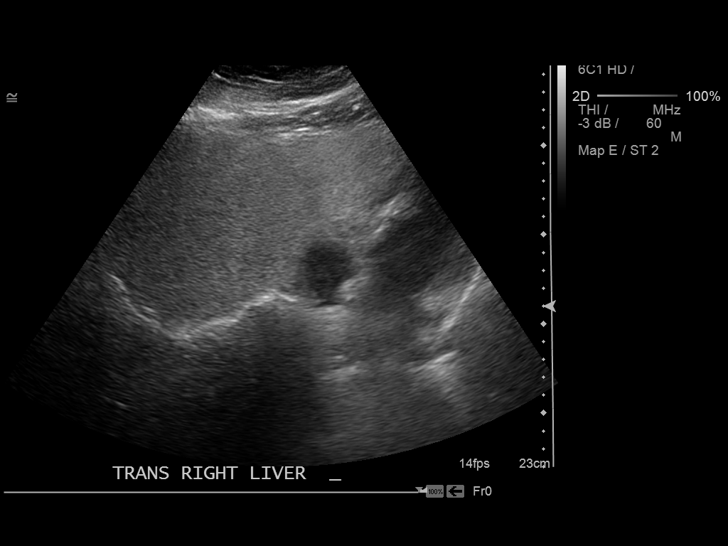
[im 45/97]
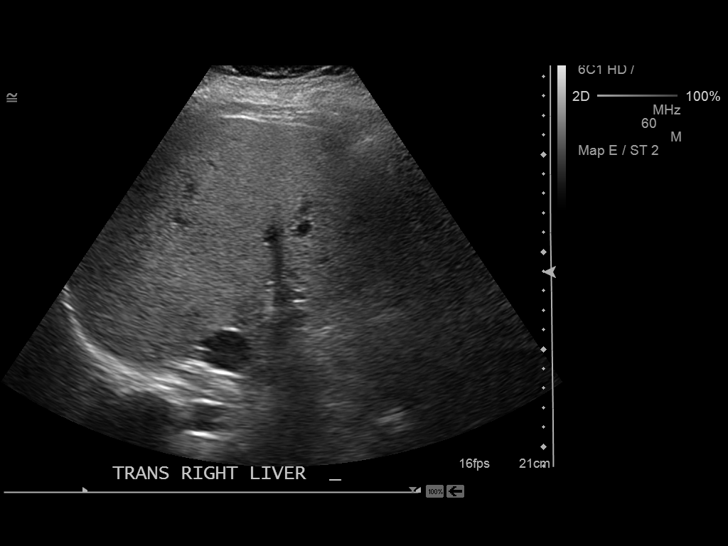
[im 53/97]
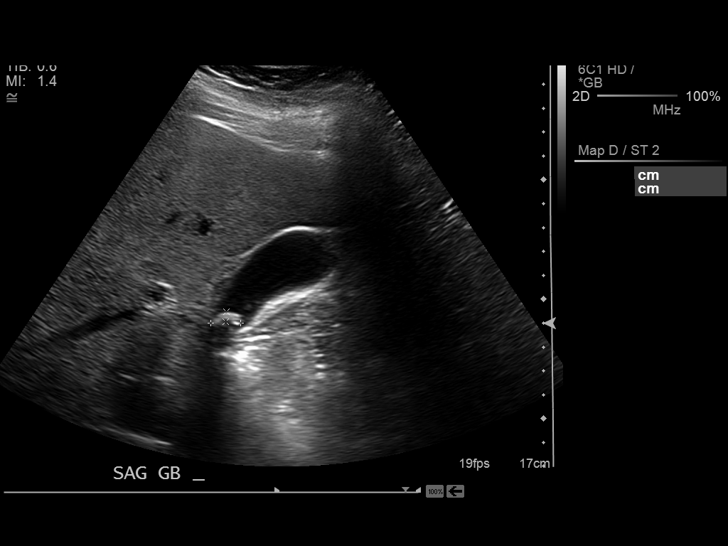
[im 61/97]
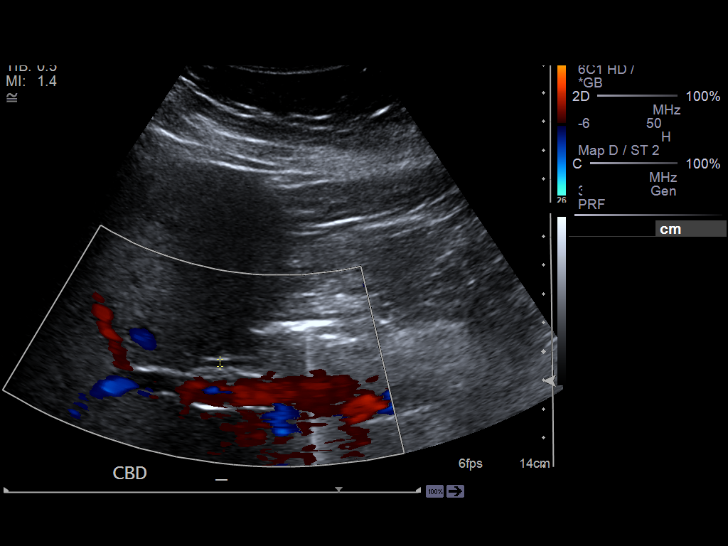
[im 65/97]
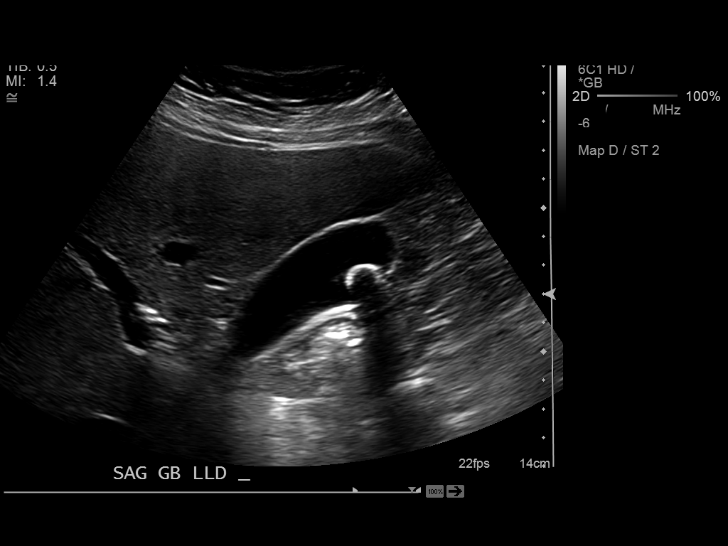
[im 73/97]
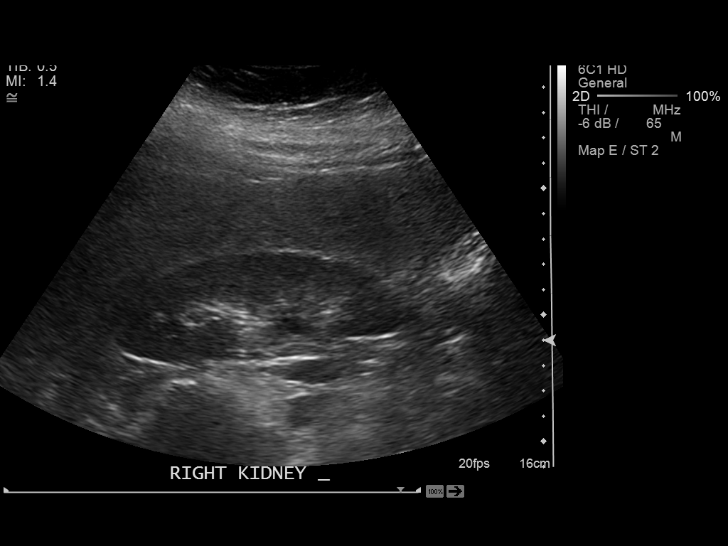
[im 81/97]
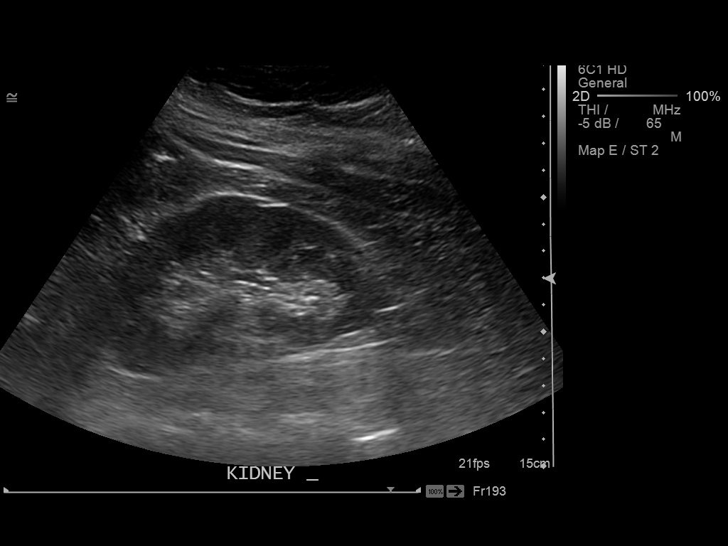
[im 89/97]
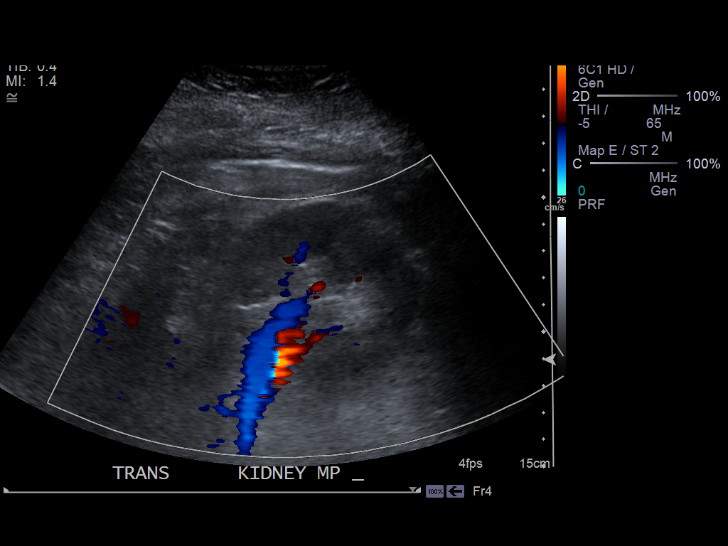
[im 97/97]
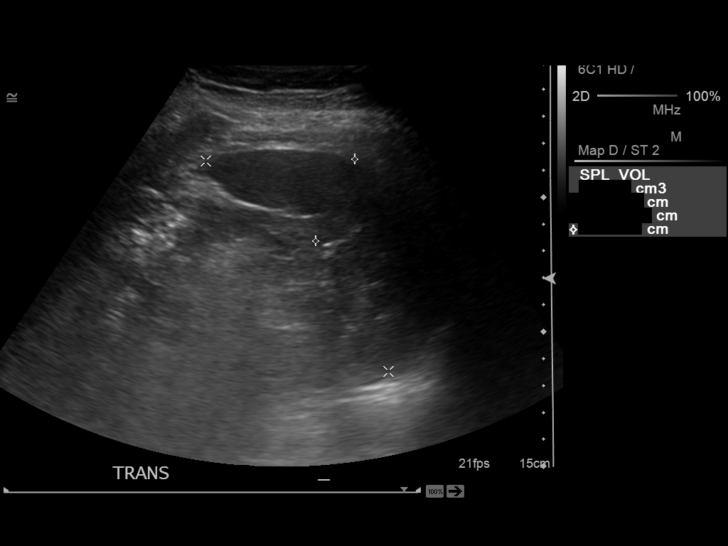

[14 of 25 positions shown; findings below may reference images not displayed]

FINDINGS: Gallbladder:

13 mm gallstone. Gallbladder wall is not thickened. Negative
sonographic Murphy sign.

Common bile duct:

Diameter: 2.5 mm.

Liver:

No focal lesion identified. Lesion in the left lobe liver on MRI
01/28/2012 is not identified on today's ultrasound. Within normal
limits in parenchymal echogenicity.

IVC:

Negative

Pancreas:

Visualized portion unremarkable.

Spleen:

Size and appearance within normal limits.

Right Kidney:

Length: 11.7 cm.. Echogenicity within normal limits. No mass or
hydronephrosis visualized.

Left Kidney:

Length: 10.5 cm. Echogenicity within normal limits. No mass or
hydronephrosis visualized.

Abdominal aorta:

No aneurysm visualized.

Other findings:

None.
IMPRESSION: Cholelithiasis.

## 2015-12-10 DIAGNOSIS — L309 Dermatitis, unspecified: Secondary | ICD-10-CM | POA: Insufficient documentation

## 2017-01-21 ENCOUNTER — Other Ambulatory Visit: Payer: Self-pay | Admitting: Nurse Practitioner

## 2017-01-21 DIAGNOSIS — K7689 Other specified diseases of liver: Secondary | ICD-10-CM

## 2017-01-28 ENCOUNTER — Encounter: Payer: Self-pay | Admitting: Radiology

## 2017-01-28 ENCOUNTER — Ambulatory Visit
Admission: RE | Admit: 2017-01-28 | Discharge: 2017-01-28 | Disposition: A | Discharge status: Home / Self Care | Payer: 59 | Source: Ambulatory Visit | Attending: Nurse Practitioner | Admitting: Nurse Practitioner

## 2017-01-28 DIAGNOSIS — K7689 Other specified diseases of liver: Secondary | ICD-10-CM

## 2017-01-28 DIAGNOSIS — E279 Disorder of adrenal gland, unspecified: Secondary | ICD-10-CM | POA: Diagnosis not present

## 2017-01-28 MED ORDER — GADOXETATE DISODIUM 0.25 MMOL/ML IV SOLN
8.0000 mL | Freq: Once | INTRAVENOUS | Status: AC | PRN
  Administered 2017-01-28: 8 mL via INTRAVENOUS

## 2018-06-09 ENCOUNTER — Other Ambulatory Visit: Payer: Self-pay | Admitting: Neurology

## 2018-06-09 DIAGNOSIS — R2990 Unspecified symptoms and signs involving the nervous system: Secondary | ICD-10-CM

## 2018-06-18 ENCOUNTER — Ambulatory Visit
Admission: EM | Admit: 2018-06-18 | Discharge: 2018-06-18 | Disposition: A | Discharge status: Home / Self Care | Payer: Managed Care, Other (non HMO) | Attending: Emergency Medicine | Admitting: Emergency Medicine

## 2018-06-18 ENCOUNTER — Other Ambulatory Visit: Payer: Self-pay

## 2018-06-18 DIAGNOSIS — G43709 Chronic migraine without aura, not intractable, without status migrainosus: Secondary | ICD-10-CM | POA: Diagnosis not present

## 2018-06-18 HISTORY — DX: Essential (primary) hypertension: I10

## 2018-06-18 HISTORY — DX: Migraine, unspecified, not intractable, without status migrainosus: G43.909

## 2018-06-18 HISTORY — DX: Pure hypercholesterolemia, unspecified: E78.00

## 2018-06-18 HISTORY — DX: Fibromyalgia: M79.7

## 2018-06-18 HISTORY — DX: Endometriosis, unspecified: N80.9

## 2018-06-18 HISTORY — DX: Depression, unspecified: F32.A

## 2018-06-18 HISTORY — DX: Major depressive disorder, single episode, unspecified: F32.9

## 2018-06-18 MED ORDER — BUTALBITAL-APAP-CAFFEINE 50-325-40 MG PO TABS: 1.0000 | ORAL_TABLET | Freq: Four times a day (QID) | ORAL | 0 refills | Status: AC | PRN

## 2018-06-18 MED ORDER — KETOROLAC TROMETHAMINE 60 MG/2ML IM SOLN
60.0000 mg | Freq: Once | INTRAMUSCULAR | Status: AC
  Administered 2018-06-18: 60 mg via INTRAMUSCULAR

## 2018-06-18 MED ORDER — ONDANSETRON 8 MG PO TBDP
8.0000 mg | ORAL_TABLET | Freq: Once | ORAL | Status: AC
  Administered 2018-06-18: 8 mg via ORAL

## 2018-06-18 MED ORDER — ONDANSETRON 8 MG PO TBDP: 8.0000 mg | ORAL_TABLET | Freq: Two times a day (BID) | ORAL | 0 refills | Status: DC

## 2018-06-18 NOTE — ED Provider Notes (Signed)
[facility]    CSN: [id number] Arrival date & time: 06/18/18  1339     History   Chief Complaint Chief Complaint  Patient presents with  . Migraine    HPI Terrica Ivianna Notch is a 42 y.o. female.   HPI  -year-old female with a complicated history.  She states that she has had a headache since October 3.  The saw A neurologist after she was noticed with stroke symptoms in the emergency room.  A CAT scan showed that this was not present.  He stated that even though she had no CVA findings for a stroke it could have been a TIA that was not seen on the CAT scan and is scheduled for an MRI.  The neurologist recommending Toradol injections for her migraine.  The one from her primary care physician on Friday and 30 mg.  She states that it took it out of "migraine level" but never completely eliminated her migraine symptoms.  Scheduled for an MRI upon her return from a business trip to [place] this week.  She takes Fioricet as necessary usually only receives 3 to 4 hours of relief.  She is taking high doses of Tylenol.  She is status post gastric bypass surgery and is not able to take NSAIDs p.o. Her migraine today is rated as a 9 out of 10.  She has light sensitivity and nausea.  Pain is throbbing bilateral in the frontal areas and occipital areas.  She is wearing sunglasses in the room.  States that she has to dim her lights in her office and in her home and has difficulty watching the computer screen and keeps it in the night mode most of the time.  Scheduled to leave for a trade show in [place] on Monday.     Past Medical History:  Diagnosis Date  . Depression   . Endometriosis   . Fibromyalgia   . High cholesterol   . Hypertension   . Migraines     Patient Active Problem List   Diagnosis Date Noted  . Adrenal adenoma 07/14/2015  . Edema extremities 07/14/2015  . 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 Fibromyalgia 07/14/2015  . 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 Acne inversa 07/14/2015  . History of colon polyps 07/14/2015  . Insomnia, persistent 07/14/2015  . Headache, migraine, intractable 07/14/2015  . 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 Elevated WBC count 07/14/2015  . Gastroduodenal ulcer 07/14/2015  . Borderline diabetes mellitus 07/14/2015  . Difficulty sleeping 07/14/2015  . Fast heart beat 07/14/2015  . Tobacco use disorder, mild, in early remission 07/14/2015    Past Surgical History:  Procedure Laterality Date  . BREAST BIOPSY Right   . GASTRIC BYPASS    . TOTAL ABDOMINAL HYSTERECTOMY  2012   endometriosis    OB History   None      Home Medications    Prior to Admission medications   Medication Sig Start Date End Date Taking? Authorizing Provider  butalbital-aspirin-caffeine [facility]) 50-325-40 MG capsule Take by mouth 2 (two) times daily as needed for headache.   Yes [provider]  Fremanezumab-vfrm (AJOVY) 225 MG/1.5ML SOSY Inject into the skin.   Yes [provider]  Fremanezumab-vfrm 225 MG/1.5ML SOSY Inject into the skin. 06/12/18  Yes [provider]  venlafaxine XR (EFFEXOR-XR) 75 MG 24 hr capsule Take by mouth. 03/29/18 03/29/19 Yes [provider]  butalbital-acetaminophen-caffeine (FIORICET, ESGIC) 50-325-40 MG tablet Take 1-2 tablets by mouth  every 6 (six) hours as needed for headache. 06/18/18 06/18/19  Lorin Picket, PA-C  ondansetron (ZOFRAN ODT) 8 MG disintegrating tablet Take 1 tablet (8 mg total) by mouth 2 (two) times daily. 06/18/18   Lorin Picket, PA-C  traZODone (DESYREL) 100 MG tablet Take 1 tablet by mouth at bedtime. 08/02/14   [provider]    Family History Family History  Problem Relation Age of Onset  . Diabetes Mother   . Breast cancer Mother   . CAD Brother 49    Social History Social History   Tobacco Use  . Smoking status: Former Smoker    Types: Cigarettes    Last attempt  to quit: 06/19/2015    Years since quitting: 3.0  . Smokeless tobacco: Never Used  Substance Use Topics  . Alcohol use: No    Alcohol/week: 0.0 standard drinks  . Drug use: Never     Allergies   Penicillins and Sulfa antibiotics   Review of Systems Review of Systems  Constitutional: Positive for activity change. Negative for appetite change, chills, fatigue and fever.  Eyes: Positive for photophobia.  Neurological: Positive for headaches. Negative for dizziness, tremors, seizures, syncope, facial asymmetry, speech difficulty, weakness, light-headedness and numbness.  All other systems reviewed and are negative.    Physical Exam Triage Vital Signs ED Triage Vitals [06/18/18 1358]  Enc Vitals Group     BP 124/84     Pulse Rate (!) 103     Resp      Temp 98.4 F (36.9 C)     Temp Source Oral     SpO2 99 %     Weight 194 lb (88 kg)     Height 5' 7"  (1.702 m)     Head Circumference      Peak Flow      Pain Score 10     Pain Loc      Pain Edu?      Excl. in [place]?    No data found.  Updated Vital Signs BP 124/84 (BP Location: Left Arm)   Pulse (!) 103   Temp 98.4 F (36.9 C) (Oral)   Ht 5' 7"  (1.702 m)   Wt 194 lb (88 kg)   SpO2 99%   BMI 30.38 kg/m   Visual Acuity Right Eye Distance:   Left Eye Distance:   Bilateral Distance:    Right Eye Near:   Left Eye Near:    Bilateral Near:     Physical Exam  Constitutional: She is oriented to person, place, and time. She appears well-developed and well-nourished. No distress.  HENT:  Head: Normocephalic.  Eyes: Pupils are equal, round, and reactive to light. EOM are normal. Right eye exhibits no discharge. Left eye exhibits no discharge.  Neck: Normal range of motion. Neck supple.  Musculoskeletal: Normal range of motion.  Neurological: She is alert and oriented to person, place, and time. No cranial nerve deficit. She exhibits normal muscle tone. Coordination normal.  Skin: Skin is warm and dry. She is not  diaphoretic.  Psychiatric: She has a normal mood and affect. Her behavior is normal. Judgment and thought content normal.  Nursing note and vitals reviewed.    UC Treatments / Results  Labs (all labs ordered are listed, but only abnormal results are displayed) Labs Reviewed - No data to display  EKG None  Radiology No results found.  Procedures Procedures (including critical care time)  Medications Ordered in UC Medications  ondansetron (ZOFRAN-ODT) disintegrating  tablet 8 mg (8 mg Oral Given 06/18/18 1503)  ketorolac (TORADOL) injection 60 mg (60 mg Intramuscular Given 06/18/18 1504)   Her pain level fell from a 9-10 out of 10 to a 6 out of 10  Initial Impression / Assessment and Plan / UC Course  I have reviewed the triage vital signs and the nursing notes.  Pertinent labs & imaging results that were available during my care of the patient were reviewed by me and considered in my medical decision making (see chart for details).    Patient was receiving relief from the Toradol injection.  We will provide her with Fioricet for her trip to [place] as well as Zofran.  He will need to follow-up with her primary care upon her return as well as follow-up with her neurologist.  She has problems in the [place] area she will need to go to emergency room. Final Clinical Impressions(s) / UC Diagnoses   Final diagnoses:  Chronic migraine without aura without status migrainosus, not intractable   Discharge Instructions   None    ED Prescriptions    Medication Sig Dispense Auth. Provider   ondansetron (ZOFRAN ODT) 8 MG disintegrating tablet Take 1 tablet (8 mg total) by mouth 2 (two) times daily. 6 tablet Crecencio Mc P, PA-C   butalbital-acetaminophen-caffeine (FIORICET, ESGIC) 50-325-40 MG tablet Take 1-2 tablets by mouth every 6 (six) hours as needed for headache. 20 tablet Lorin Picket, PA-C     Controlled Substance Prescriptions [place] Controlled Substance  Registry consulted? No   Lorin Picket, PA-C 06/18/18 1546

## 2018-06-18 NOTE — ED Triage Notes (Signed)
Pt states she has had a headache since Oct 3rd. Saw her PCP on Friday and got a Toradol shot. Is leaving on vacation tomorrow. Scheduled for MRI when she returns from vacation and is seeing a neurologist to manage the headache. Pain 10/10.

## 2018-06-27 ENCOUNTER — Ambulatory Visit: Payer: Managed Care, Other (non HMO)

## 2018-07-10 ENCOUNTER — Other Ambulatory Visit: Payer: Self-pay | Admitting: Family Medicine

## 2018-07-10 DIAGNOSIS — Z1231 Encounter for screening mammogram for malignant neoplasm of breast: Secondary | ICD-10-CM

## 2018-07-24 ENCOUNTER — Ambulatory Visit
Admission: RE | Admit: 2018-07-24 | Discharge: 2018-07-24 | Disposition: A | Discharge status: Home / Self Care | Payer: Managed Care, Other (non HMO) | Source: Ambulatory Visit | Attending: Family Medicine | Admitting: Family Medicine

## 2018-07-24 DIAGNOSIS — Z1231 Encounter for screening mammogram for malignant neoplasm of breast: Secondary | ICD-10-CM | POA: Insufficient documentation

## 2018-07-26 ENCOUNTER — Ambulatory Visit
Admission: RE | Admit: 2018-07-26 | Discharge: 2018-07-26 | Disposition: A | Discharge status: Home / Self Care | Payer: Managed Care, Other (non HMO) | Source: Ambulatory Visit | Attending: Neurology | Admitting: Neurology

## 2018-07-26 DIAGNOSIS — R51 Headache: Secondary | ICD-10-CM | POA: Diagnosis not present

## 2018-07-26 DIAGNOSIS — R531 Weakness: Secondary | ICD-10-CM | POA: Diagnosis not present

## 2018-07-26 DIAGNOSIS — R2990 Unspecified symptoms and signs involving the nervous system: Secondary | ICD-10-CM | POA: Diagnosis not present

## 2018-09-11 DIAGNOSIS — J329 Chronic sinusitis, unspecified: Secondary | ICD-10-CM | POA: Insufficient documentation

## 2018-10-24 ENCOUNTER — Other Ambulatory Visit: Payer: Self-pay | Admitting: Neurology

## 2018-10-24 DIAGNOSIS — M542 Cervicalgia: Secondary | ICD-10-CM

## 2018-10-26 DIAGNOSIS — K591 Functional diarrhea: Secondary | ICD-10-CM | POA: Insufficient documentation

## 2018-10-30 ENCOUNTER — Other Ambulatory Visit: Payer: Self-pay | Admitting: Family Medicine

## 2018-10-30 ENCOUNTER — Other Ambulatory Visit (HOSPITAL_COMMUNITY): Payer: Self-pay | Admitting: Family Medicine

## 2018-10-30 DIAGNOSIS — R197 Diarrhea, unspecified: Secondary | ICD-10-CM

## 2018-10-31 ENCOUNTER — Ambulatory Visit: Payer: Managed Care, Other (non HMO)

## 2018-11-01 ENCOUNTER — Ambulatory Visit
Admission: RE | Admit: 2018-11-01 | Discharge: 2018-11-01 | Disposition: A | Discharge status: Home / Self Care | Payer: Managed Care, Other (non HMO) | Source: Ambulatory Visit | Attending: Family Medicine | Admitting: Family Medicine

## 2018-11-01 DIAGNOSIS — R197 Diarrhea, unspecified: Secondary | ICD-10-CM | POA: Insufficient documentation

## 2018-11-03 ENCOUNTER — Other Ambulatory Visit: Payer: Self-pay | Admitting: Family Medicine

## 2018-11-03 ENCOUNTER — Other Ambulatory Visit (HOSPITAL_COMMUNITY): Payer: Self-pay | Admitting: Family Medicine

## 2018-11-03 DIAGNOSIS — R197 Diarrhea, unspecified: Secondary | ICD-10-CM

## 2020-02-14 DIAGNOSIS — R197 Diarrhea, unspecified: Secondary | ICD-10-CM | POA: Insufficient documentation

## 2020-02-18 ENCOUNTER — Other Ambulatory Visit
Admission: RE | Admit: 2020-02-18 | Discharge: 2020-02-18 | Disposition: A | Discharge status: Home / Self Care | Payer: Managed Care, Other (non HMO) | Source: Ambulatory Visit | Attending: Internal Medicine | Admitting: Internal Medicine

## 2020-02-18 ENCOUNTER — Other Ambulatory Visit: Payer: Self-pay

## 2020-02-18 DIAGNOSIS — Z01812 Encounter for preprocedural laboratory examination: Secondary | ICD-10-CM | POA: Diagnosis present

## 2020-02-18 DIAGNOSIS — Z20822 Contact with and (suspected) exposure to covid-19: Secondary | ICD-10-CM | POA: Insufficient documentation

## 2020-02-18 LAB — SARS CORONAVIRUS 2 (TAT 6-24 HRS): SARS Coronavirus 2: NEGATIVE

## 2020-02-19 ENCOUNTER — Encounter: Payer: Self-pay | Admitting: Internal Medicine

## 2020-02-20 ENCOUNTER — Encounter: Payer: Self-pay | Admitting: Internal Medicine

## 2020-02-20 ENCOUNTER — Other Ambulatory Visit: Payer: Self-pay

## 2020-02-20 ENCOUNTER — Ambulatory Visit: Payer: Managed Care, Other (non HMO) | Admitting: Certified Registered Nurse Anesthetist

## 2020-02-20 ENCOUNTER — Ambulatory Visit
Admission: RE | Admit: 2020-02-20 | Discharge: 2020-02-20 | Disposition: A | Discharge status: Home / Self Care | Payer: Managed Care, Other (non HMO) | Attending: Internal Medicine | Admitting: Internal Medicine

## 2020-02-20 ENCOUNTER — Encounter
Admission: RE | Disposition: A | Discharge status: Home / Self Care | Payer: Self-pay | Source: Home / Self Care | Attending: Internal Medicine

## 2020-02-20 DIAGNOSIS — K219 Gastro-esophageal reflux disease without esophagitis: Secondary | ICD-10-CM | POA: Diagnosis not present

## 2020-02-20 DIAGNOSIS — M797 Fibromyalgia: Secondary | ICD-10-CM | POA: Insufficient documentation

## 2020-02-20 DIAGNOSIS — R112 Nausea with vomiting, unspecified: Secondary | ICD-10-CM | POA: Diagnosis not present

## 2020-02-20 DIAGNOSIS — G4730 Sleep apnea, unspecified: Secondary | ICD-10-CM | POA: Diagnosis not present

## 2020-02-20 DIAGNOSIS — R197 Diarrhea, unspecified: Secondary | ICD-10-CM | POA: Insufficient documentation

## 2020-02-20 DIAGNOSIS — G43909 Migraine, unspecified, not intractable, without status migrainosus: Secondary | ICD-10-CM | POA: Insufficient documentation

## 2020-02-20 DIAGNOSIS — J45909 Unspecified asthma, uncomplicated: Secondary | ICD-10-CM | POA: Insufficient documentation

## 2020-02-20 DIAGNOSIS — Z9884 Bariatric surgery status: Secondary | ICD-10-CM | POA: Diagnosis not present

## 2020-02-20 DIAGNOSIS — F329 Major depressive disorder, single episode, unspecified: Secondary | ICD-10-CM | POA: Diagnosis not present

## 2020-02-20 DIAGNOSIS — Z87891 Personal history of nicotine dependence: Secondary | ICD-10-CM | POA: Insufficient documentation

## 2020-02-20 DIAGNOSIS — Z79899 Other long term (current) drug therapy: Secondary | ICD-10-CM | POA: Diagnosis not present

## 2020-02-20 DIAGNOSIS — I10 Essential (primary) hypertension: Secondary | ICD-10-CM | POA: Diagnosis not present

## 2020-02-20 DIAGNOSIS — R1013 Epigastric pain: Secondary | ICD-10-CM | POA: Insufficient documentation

## 2020-02-20 HISTORY — DX: Nonalcoholic steatohepatitis (NASH): K75.81

## 2020-02-20 HISTORY — DX: Gastro-esophageal reflux disease without esophagitis: K21.9

## 2020-02-20 HISTORY — DX: Cardiac arrhythmia, unspecified: I49.9

## 2020-02-20 HISTORY — PX: ESOPHAGOGASTRODUODENOSCOPY: SHX5428

## 2020-02-20 HISTORY — DX: Unspecified asthma, uncomplicated: J45.909

## 2020-02-20 HISTORY — DX: Sleep apnea, unspecified: G47.30

## 2020-02-20 SURGERY — EGD (ESOPHAGOGASTRODUODENOSCOPY)
Anesthesia: General

## 2020-02-20 MED ORDER — PROPOFOL 10 MG/ML IV BOLUS
INTRAVENOUS | Status: AC
  Filled 2020-02-20: qty 20

## 2020-02-20 MED ORDER — LIDOCAINE HCL (CARDIAC) PF 100 MG/5ML IV SOSY
PREFILLED_SYRINGE | INTRAVENOUS | Status: DC | PRN
  Administered 2020-02-20: 60 mg via INTRAVENOUS

## 2020-02-20 MED ORDER — SODIUM CHLORIDE 0.9 % IV SOLN: INTRAVENOUS | Status: DC

## 2020-02-20 MED ORDER — PROPOFOL 10 MG/ML IV BOLUS
INTRAVENOUS | Status: DC | PRN
  Administered 2020-02-20: 50 mg via INTRAVENOUS

## 2020-02-20 MED ORDER — PROPOFOL 500 MG/50ML IV EMUL
INTRAVENOUS | Status: DC | PRN
Start: 2020-02-20 — End: 2020-02-20
  Administered 2020-02-20: 100 ug/kg/min via INTRAVENOUS

## 2020-02-20 NOTE — Anesthesia Postprocedure Evaluation (Signed)
Anesthesia Post Note  Patient: Norma Riggs  Procedure(s) Performed: ESOPHAGOGASTRODUODENOSCOPY (EGD) (N/A )  Patient location during evaluation: Endoscopy Anesthesia Type: General Level of consciousness: awake and alert and oriented Pain management: pain level controlled Vital Signs Assessment: post-procedure vital signs reviewed and stable Respiratory status: spontaneous breathing Cardiovascular status: blood pressure returned to baseline Anesthetic complications: no   No complications documented.   Last Vitals:  Vitals:   02/20/20 1400 02/20/20 1410  BP: 124/80 120/75  Pulse: 76 71  Resp: 16 16  Temp:    SpO2: 100% 100%    Last Pain:  Vitals:   02/20/20 1236  PainSc: 6                  Kathleen Likins

## 2020-02-20 NOTE — Op Note (Signed)
[facility] Patient Name: Norma Riggs Procedure Date: 02/20/2020 1:25 PM MRN: [id number] Account #: [id number] Date of Birth: 1975-10-17 Admit Type: Outpatient Age: 44 Room: [facility] ENDO ROOM 3 Gender: Female Note Status: Finalized Procedure:             Upper GI endoscopy Indications:           Epigastric abdominal pain, Diarrhea, Nausea with                         vomiting Providers:             Benay Pike. Shabria Egley MD, MD Medicines:             Propofol per Anesthesia Complications:         No immediate complications. Procedure:             Pre-Anesthesia Assessment:                        - The risks and benefits of the procedure and the                         sedation options and risks were discussed with the                         patient. All questions were answered and informed                         consent was obtained.                        - Patient identification and proposed procedure were                         verified prior to the procedure by the nurse. The                         procedure was verified in the procedure room.                        - ASA Grade Assessment: II - A patient with mild                         systemic disease.                        - After reviewing the risks and benefits, the patient                         was deemed in satisfactory condition to undergo the                         procedure.                        After obtaining informed consent, the endoscope was                         passed under direct vision. Throughout the procedure,  the patient's blood pressure, pulse, and oxygen                         saturations were monitored continuously. The Endoscope                         was introduced through the mouth, and advanced to the                         jejunum. The upper GI endoscopy was accomplished                         without difficulty. The patient  tolerated the                         procedure well. Findings:      The esophagus was normal.      Evidence of a Roux-en-Y gastrojejunostomy was found. The gastrojejunal       anastomosis was characterized by healthy appearing mucosa. This was       traversed. The pouch-to-jejunum limb was characterized by healthy       appearing mucosa. The jejunojejunal anastomosis was characterized by       healthy appearing mucosa. The duodenum-to-jejunum limb was not examined       as it could not be traversed.      Gastric pouch was about { } cm in length. Gastric Mucosa and surgical       anastomosis appeared normal.      The cardia and gastric fundus were normal on retroflexion.      The examined jejunum was normal. Biopsies for histology were taken with       a cold forceps for evaluation of celiac disease. Impression:            - Normal esophagus.                        - Roux-en-Y gastrojejunostomy with gastrojejunal                         anastomosis characterized by healthy appearing mucosa.                        - Normal examined jejunum. Biopsied. Recommendation:        - Patient has a contact number available for                         emergencies. The signs and symptoms of potential                         delayed complications were discussed with the patient.                         Return to normal activities tomorrow. Written                         discharge instructions were provided to the patient.                        - Resume previous diet.                        -  Continue present medications.                        - Await pathology results.                        - Return to physician assistant in 3 months.                        - The findings and recommendations were discussed with                         the patient's family. Procedure Code(s):     --- Professional ---                        [phone], Esophagogastroduodenoscopy, flexible,                          transoral; with biopsy, single or multiple Diagnosis Code(s):     --- Professional ---                        R11.2, Nausea with vomiting, unspecified                        R19.7, Diarrhea, unspecified                        R10.13, Epigastric pain                        Z98.0, Intestinal bypass and anastomosis status CPT copyright 2019 [employer]. All rights reserved. The codes documented in this report are preliminary and upon coder review may  be revised to meet current compliance requirements. Efrain Sella MD, MD 02/20/2020 1:46:07 PM This report has been signed electronically. Number of Addenda: 0 Note Initiated On: 02/20/2020 1:25 PM Estimated Blood Loss:  Estimated blood loss: none.      [facility]

## 2020-02-20 NOTE — Interval H&P Note (Signed)
History and Physical Interval Note:  02/20/2020 12:02 PM  Norma Riggs  has presented today for surgery, with the diagnosis of GERD, DIARRHEA, HX OF PEPTIC ULCER.  The various methods of treatment have been discussed with the patient and family. After consideration of risks, benefits and other options for treatment, the patient has consented to  Procedure(s): ESOPHAGOGASTRODUODENOSCOPY (EGD) (N/A) as a surgical intervention.  The patient's history has been reviewed, patient examined, no change in status, stable for surgery.  I have reviewed the patient's chart and labs.  Questions were answered to the patient's satisfaction.     [place], [place]

## 2020-02-20 NOTE — Transfer of Care (Signed)
Immediate Anesthesia Transfer of Care Note  Patient: Norma Riggs  Procedure(s) Performed: ESOPHAGOGASTRODUODENOSCOPY (EGD) (N/A )  Patient Location: PACU  Anesthesia Type:General  Level of Consciousness: awake, alert  and oriented  Airway & Oxygen Therapy: Patient Spontanous Breathing  Post-op Assessment: Report given to RN  Post vital signs: Reviewed and stable  Last Vitals:  Vitals Value Taken Time  BP 113/76 02/20/20 1343  Temp 36.6 C 02/20/20 1340  Pulse 80 02/20/20 1343  Resp 14 02/20/20 1343  SpO2 100 % 02/20/20 1343    Last Pain:  Vitals:   02/20/20 1236  PainSc: 6          Complications: No complications documented.

## 2020-02-20 NOTE — H&P (Signed)
Outpatient short stay form Pre-procedure 02/20/2020 12:00 PM Norma Riggs K. Alice Reichert, M.D.  Primary Physician: Nelwyn Salisbury, PA-C  Reason for visit: Epigastric pain, Nausea  History of present illness:  44 y/o female s/p RnY gastric bypass in 2016 presents with persistent nausea, vomiting and epigastric pain. Has diarrhea as well. She is not following a post-bariatric surgery diet. She is not able to prepare her own meals at home due to no working refrigerator.    No current facility-administered medications for this encounter.  Medications Prior to Admission  Medication Sig Dispense Refill Last Dose  . albuterol (VENTOLIN HFA) 108 (90 Base) MCG/ACT inhaler Inhale into the lungs every 6 (six) hours as needed for wheezing or shortness of breath.     Marland Kitchen buPROPion (WELLBUTRIN) 75 MG tablet Take 75 mg by mouth 2 (two) times daily.     . clobetasol ointment (TEMOVATE) 5.39 % Apply 1 application topically 2 (two) times daily.     . hydrocortisone cream 0.5 % Apply 1 application topically 2 (two) times daily.     . hydrOXYzine (ATARAX/VISTARIL) 25 MG tablet Take 25 mg by mouth 3 (three) times daily as needed.     . Multiple Vitamins-Minerals (HAIR SKIN & NAILS ADVANCED PO) Take by mouth daily.     . butalbital-aspirin-caffeine (FIORINAL) 50-325-40 MG capsule Take by mouth 2 (two) times daily as needed for headache.     . Fremanezumab-vfrm (AJOVY) 225 MG/1.5ML SOSY Inject into the skin.     . Fremanezumab-vfrm 225 MG/1.5ML SOSY Inject into the skin.     Marland Kitchen ondansetron (ZOFRAN ODT) 8 MG disintegrating tablet Take 1 tablet (8 mg total) by mouth 2 (two) times daily. 6 tablet 0   . traZODone (DESYREL) 100 MG tablet Take 1 tablet by mouth at bedtime.     Marland Kitchen venlafaxine XR (EFFEXOR-XR) 75 MG 24 hr capsule Take 150 mg by mouth daily.         Allergies  Allergen Reactions  . Gadolinium Derivatives Hives    Per pt she broke out in hives after receiving MRI contrast in 2010 (not CT contrast). MSY   .  Penicillins Nausea And Vomiting    Other reaction(s): Hypotension  . Sulfa Antibiotics Itching     Past Medical History:  Diagnosis Date  . Asthma   . Depression   . Dysrhythmia    tachycardia  . Endometriosis   . Fibromyalgia   . GERD (gastroesophageal reflux disease)   . High cholesterol   . Hypertension   . Migraines   . NASH (nonalcoholic steatohepatitis)   . Sleep apnea    on c-pap    Review of systems:  Otherwise negative.    Physical Exam  Gen: Alert, oriented. Appears stated age.  HEENT: [place]/AT. PERRLA. Lungs: CTA, no wheezes. CV: RR nl S1, S2. Abd: soft, benign, no masses. BS+ Ext: No edema. Pulses 2+    Planned procedures: Proceed with EGD. The patient understands the nature of the planned procedure, indications, risks, alternatives and potential complications including but not limited to bleeding, infection, perforation, damage to internal organs and possible oversedation/side effects from anesthesia. The patient agrees and gives consent to proceed.  Please refer to procedure notes for findings, recommendations and patient disposition/instructions.     Norma Riggs K. Alice Reichert, M.D. Gastroenterology 02/20/2020  12:00 PM

## 2020-02-20 NOTE — Anesthesia Preprocedure Evaluation (Signed)
Anesthesia Evaluation  Patient identified by MRN, date of birth, ID band Patient awake    Reviewed: Allergy & Precautions, NPO status , Patient's Chart, lab work & pertinent test results  Airway Mallampati: II  TM Distance: >3 FB     Dental   Pulmonary asthma , sleep apnea , former smoker,           Cardiovascular hypertension,      Neuro/Psych  Headaches, PSYCHIATRIC DISORDERS Depression    GI/Hepatic PUD, GERD  ,(+) Hepatitis -  Endo/Other  negative endocrine ROS  Renal/GU negative Renal ROS  negative genitourinary   Musculoskeletal  (+) Fibromyalgia -  Abdominal   Peds negative pediatric ROS (+)  Hematology negative hematology ROS (+)   Anesthesia Other Findings   Reproductive/Obstetrics                             Anesthesia Physical Anesthesia Plan  ASA: III  Anesthesia Plan: General   Post-op Pain Management:    Induction: Intravenous  PONV Risk Score and Plan:   Airway Management Planned: Nasal Cannula  Additional Equipment:   Intra-op Plan:   Post-operative Plan:   Informed Consent: I have reviewed the patients History and Physical, chart, labs and discussed the procedure including the risks, benefits and alternatives for the proposed anesthesia with the patient or authorized representative who has indicated his/her understanding and acceptance.     Dental advisory given  Plan Discussed with: CRNA and Surgeon  Anesthesia Plan Comments:         Anesthesia Quick Evaluation

## 2020-02-21 ENCOUNTER — Encounter: Payer: Self-pay | Admitting: Internal Medicine

## 2020-02-22 LAB — SURGICAL PATHOLOGY

## 2020-11-24 NOTE — Progress Notes (Signed)
[facility]  [address], [address] [place], [place] [address] Phone: [phone]  Fax: [phone]   Clinic Day:  11/25/2020  Referring physician: Ulyess Blossom, PA  Chief Complaint: Keri Veale is a 45 y.o. 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   HPI:  The patient saw Dr. Alice Reichert on 02/14/2020. She reported significant postprandial diarrhea and occasional fecal incontinence x 3 months. She was having difficulty following her bariatric surgery diet because she did not have a working refrigerator at home. Her diarrhea was felt to be due to non-adherence to a low carbohydrate, high protein diet prescribed by her bariatric surgeon. She was prescribed xifaxan.  EGD on 02/20/2020 revealed a normal esophagus. Roux-en-Y gastrojejunostomy with gastrojejunal anastomosis characterized by healthy appearing mucosa. There was a normal examined jejunum. Pathology was unremarkable.  The patient saw Dr. Nada Boozer Jose-Mathews on 11/11/2020 in follow-up. Hematocrit was 39.2, hemoglobin 12.6, MCV 86.0, platelets 349,000, WBC 9,400. Ferritin was 5 with an iron saturation of 5% and a TIBC of 611. Vitamin B12 was 183 and folate 7.8.  The patient has sleep apnea and is not using a CPAP machine. Colonoscopy on 10/27/2018 revealed acute ileitis.  Her father had colon polyps. Her mother had breast cancer and pancreatic cancer.  Labs followed:  12/22/2014: Hematocrit 35.9, hemoglobin 11.9, MCV 89.0, platelets 352,000, WBC 16,400. 06/11/2016: Hematocrit 38.4, hemoglobin 12.7, MCV 90.0, platelets 251,000, WBC   8,700.  01/03/2017: Hematocrit 41.6, hemoglobin 13.5, MCV 90.0, platelets 243,000, WBC   8,300. 02/17/2018: Hematocrit 39.0, hemoglobin 12.7, MCV 86.0, platelets 279,000, WBC   7,900.  05/31/2019: Hematocrit 40.7,  hemoglobin 13.0, MCV 93.0, platelets 257,000, WBC 10,600. 04/10/2020: Hematocrit 38.8, hemoglobin 12.8, MCV 89.0, platelets 317,000, WBC   7,700. 11/11/2020: Hematocrit 39.2, hemoglobin 12.6, MCV 86.0, platelets 349,000, WBC   9,400.  Ferritin has been followed:  22 on 05/2016, 27 on 01/03/2017, 8 on 02/17/2018, 10 on 07/20/2019, and 5 on 11/11/2020.  Iron saturation has ranged from 5-15%. B12 has been followed: 361 on 06/11/2016, 346 on 01/03/2017, 275 on 02/17/2018, 147 on 07/20/2019, and 183 on 11/11/2020.  Folate has ranged from 7.8 - 43.0.  Symptomatically, she has been "alive". She notes that she is "not having a good day mentally." She became tearful during her visit today. Around 12/2019, the patient developed a number of new symptoms including fatigue, night sweats, "mind fog," weakness, dizziness when she stands up, and insomnia.  The patient has struggled with diarrhea since giving birth 38 years ago and sometimes has rectal bleeding. She has headaches and her eyes are sensitive to light. She has fibromyalgia. Her son has a cold and the patient thinks she may be coming down with the same thing. Her gums bleed when she brushes her teeth. She had petechiae recently, but they have resolved.  The patient denies fevers, weight loss, changes in vision, shortness of breath, chest pain, palpitations, nausea, vomiting, black stools, reflux, urinary symptoms, hematuria, numbness, weakness, and balance or coordination problems.  The patient has not been eating "right" lately. She has been eating a lot of fast food. She is getting back on track with her diet. She has started eating steak two nights per week, beans, spinach, salads, and chicken.   The patient has lost about 100 lbs since her bariatric surgery in 2016. She has been taking sublingual B12 since her surgery. She  started taking oral iron once daily about 3 weeks ago. She had a total hysterectomy in 2012 due to endometriosis.  The patient  has a family history of pancreatic cancer, breast cancer, and colon cancer.   Past Medical History:  Diagnosis Date  . Asthma   . Depression   . Dysrhythmia    tachycardia  . Endometriosis   . Fibromyalgia   . GERD (gastroesophageal reflux disease)   . High cholesterol   . Hypertension   . Migraines   . NASH (nonalcoholic steatohepatitis)   . Sleep apnea    on c-pap    Past Surgical History:  Procedure Laterality Date  . BREAST BIOPSY Right 2009   neg  . ESOPHAGOGASTRODUODENOSCOPY N/A 02/20/2020   Procedure: ESOPHAGOGASTRODUODENOSCOPY (EGD);  Surgeon: Toledo, Benay Pike, MD;  Location: [facility] ENDOSCOPY;  Service: Gastroenterology;  Laterality: N/A;  . GASTRIC BYPASS    . TOTAL ABDOMINAL HYSTERECTOMY  2012   endometriosis    Family History  Problem Relation Age of Onset  . Diabetes Mother   . Breast cancer Mother 73  . CAD Brother 58    Social History:  reports that she quit smoking about 5 years ago. Her smoking use included cigarettes. She has never used smokeless tobacco. She reports that she does not drink alcohol and does not use drugs. She is smoking a pack per day. She has quit in the past and is trying to quit again. She does not work outside of the home. She has a 65 year old daughter and an 84 year old son. The patient is alone today.  Allergies:  Allergies  Allergen Reactions  . Gadolinium Derivatives Hives    Per pt she broke out in hives after receiving MRI contrast in 2010 (not CT contrast). MSY   . Penicillins Nausea And Vomiting    Other reaction(s): Hypotension  . Sulfa Antibiotics Itching    Current Medications: Current Outpatient Medications  Medication Sig Dispense Refill  . ferrous sulfate 325 (65 FE) MG tablet Take 325 mg by mouth daily with breakfast.    . Multiple Vitamins-Minerals (HAIR SKIN & NAILS ADVANCED PO) Take by mouth daily.    . traZODone (DESYREL) 100 MG tablet Take 1 tablet by mouth at bedtime.    Marland Kitchen venlafaxine XR (EFFEXOR-XR) 150  MG 24 hr capsule Take 1 capsule by mouth daily.    Marland Kitchen albuterol (VENTOLIN HFA) 108 (90 Base) MCG/ACT inhaler Inhale into the lungs every 6 (six) hours as needed for wheezing or shortness of breath. (Patient not taking: Reported on 11/25/2020)     No current facility-administered medications for this visit.    Review of Systems  Constitutional: Positive for diaphoresis (night sweats) and malaise/fatigue. Negative for chills, fever and weight loss.  HENT: Negative for congestion, ear discharge, ear pain, hearing loss, nosebleeds, sinus pain, sore throat and tinnitus.        Gum bleeds.  Eyes: Positive for photophobia. Negative for blurred vision.  Respiratory: Negative for cough, hemoptysis, sputum production and shortness of breath.   Cardiovascular: Negative for chest pain, palpitations and leg swelling.  Gastrointestinal: Positive for blood in stool (with diarrhea, occasional) and diarrhea. Negative for abdominal pain, constipation, heartburn, melena, nausea and vomiting.  Genitourinary: Negative for dysuria, frequency, hematuria and urgency.  Musculoskeletal: Positive for myalgias (Fibromyalgia). Negative for back pain, joint pain and neck pain.  Skin: Negative for itching and rash.       Petechiae, resolved  Neurological: Positive for dizziness (when she stands  up), weakness and headaches. Negative for tingling and sensory change.       "mind fog"  Endo/Heme/Allergies: Does not bruise/bleed easily.  Psychiatric/Behavioral: Negative for depression and memory loss. The patient has insomnia. The patient is not nervous/anxious.        Tearful. "Not having a good day mentally."  All other systems reviewed and are negative.  Performance status (ECOG): 1  Vitals Blood pressure 118/89, pulse 81, temperature 98.3 F (36.8 C), temperature source Oral, resp. rate 20, height 5\' 7"  (1.702 m), weight 205 lb (93 kg).   Physical Exam Vitals and nursing note reviewed.  Constitutional:      General:  She is not in acute distress.    Appearance: She is not diaphoretic.  HENT:     Head: Normocephalic and atraumatic.     Comments: Jacket with hood up.    Mouth/Throat:     Mouth: Mucous membranes are moist.     Pharynx: Oropharynx is clear.  Eyes:     General: No scleral icterus.    Extraocular Movements: Extraocular movements intact.     Conjunctiva/sclera: Conjunctivae normal.     Pupils: Pupils are equal, round, and reactive to light.     Comments: Blue tinted glasses  Cardiovascular:     Rate and Rhythm: Normal rate and regular rhythm.     Heart sounds: Normal heart sounds. No murmur heard.   Pulmonary:     Effort: Pulmonary effort is normal. No respiratory distress.     Breath sounds: Normal breath sounds. No wheezing or rales.  Chest:     Chest wall: No tenderness.  Breasts:     Right: No axillary adenopathy or supraclavicular adenopathy.     Left: No axillary adenopathy or supraclavicular adenopathy.    Abdominal:     General: Bowel sounds are normal. There is no distension.     Palpations: Abdomen is soft. There is no mass.     Tenderness: There is abdominal tenderness (epigastric area). There is no guarding or rebound.  Musculoskeletal:        General: No swelling or tenderness. Normal range of motion.     Cervical back: Normal range of motion and neck supple.  Lymphadenopathy:     Head:     Right side of head: No preauricular, posterior auricular or occipital adenopathy.     Left side of head: No preauricular, posterior auricular or occipital adenopathy.     Cervical: No cervical adenopathy.     Upper Body:     Right upper body: No supraclavicular or axillary adenopathy.     Left upper body: No supraclavicular or axillary adenopathy.     Lower Body: No right inguinal adenopathy. No left inguinal adenopathy.  Skin:    General: Skin is warm and dry.  Neurological:     Mental Status: She is alert and oriented to person, place, and time.  Psychiatric:         Thought Content: Thought content normal.        Judgment: Judgment normal.     Comments: Tearful    No visits with results within 3 Day(s) from this visit.  Latest known visit with results is:  Admission on 02/20/2020, Discharged on 02/20/2020  Component Date Value Ref Range Status  . SURGICAL PATHOLOGY 02/20/2020    Final-Edited                   Value:SURGICAL PATHOLOGY CASE: [id number] PATIENT: Madalyn Rob Surgical Pathology Report  Specimen Submitted: A. Jejunum, normal; cbx  Clinical History: GERD, diarrhea, hx of peptic ulcer.  Post surgical changes due to gastric bypass      DIAGNOSIS: A. JEJUNUM; COLD BIOPSY: - UNREMARKABLE ENTERIC MUCOSA. - NEGATIVE FOR INFLAMMATION, DYSPLASIA, AND MALIGNANCY.   GROSS DESCRIPTION: A. Labeled: CBX normal jejunum to rule out enteritis Received: Formalin Tissue fragment(s): Multiple Size: Aggregate, 1.4 x 0.6 x 0.3 cm Description: Tan soft tissue fragments Entirely submitted in 1 cassette.    Final Diagnosis performed by Betsy Pries, MD.   Electronically signed 02/22/2020 2:31:34PM The electronic signature indicates that the named Attending Pathologist has evaluated the specimen Technical component performed at [facility], [address] [place], [place] [address] Lab: [phone] Dir: Rush Farmer, MD, MMM  Professional component performed at [facility], [facility], [place], [place], [place] [address] Lab: [phone] Dir: Dellia Nims. Rubinas, MD     Assessment:  Clema Skousen is a 45 y.o. 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 She has been taking sublingual B12 since her surgery. She started taking oral iron once daily about 3 weeks ago. She had a total hysterectomy in 2012 due to endometriosis.  Labs on 11/11/2020 revealed a hematocrit of 39.2, hemoglobin 12.6, MCV 86.0,  platelets 349,000, WBC 9,400. Ferritin was 5 with an iron saturation of 5% and a TIBC of 611. Vitamin B12 was 183 and folate 7.8.  Ferritin has been followed:  22 on 05/2016, 27 on 01/03/2017, 8 on 02/17/2018, 10 on 07/20/2019, and 5 on 11/11/2020.  Iron saturation has ranged from 5-15%.  Colonoscopy on 10/27/2018 revealed acute ileitis.  EGD on 02/20/2020 revealed a normal esophagus. Roux-en-Y gastrojejunostomy with gastrojejunal anastomosis characterized by healthy appearing mucosa. There was a normal examined jejunum. Pathology was unremarkable.  She has B12 deficiency.  B12 has been followed: 361 on 06/11/2016, 346 on 01/03/2017, 275 on 02/17/2018, 147 on 07/20/2019, and 183 on 11/11/2020.  Folate has ranged from 7.8 - 43.0.  The patient received the Moderna COVID-19 vaccine on 12/21/2019 and 01/18/2020. She received the [employer] on 08/27/2020.  Symptomatically, she notes fatigue, night sweats, "mind fog," weakness, dizziness when she stands up, and insomnia.  She has struggled with diarrhea for 16 years and sometimes has rectal bleeding. She has fibromyalgia. Exam reveals epigastric tenderness.  Plan: 1.  Iron deficiency  She has iron deficiency secondary to gastric bypass surgery.    She notes some history of rectal bleeding.    Ferritin was 5 on 11/11/2020.  She has been on oral iron for the past 3 weeks.  Discuss consideration of Venofer.   Potential side effects reviewed.   Information provided.   Patient in agreement.  Colonoscopy on 10/27/2018 revealed acute ileitis.  EGD on 02/20/2020 was unremarkable. 2.   B12 deficiency  She has B12 deficiency secondary to gastric bypass surgery.  B12 was 183 and folate 7.8 on 11/11/2020.  Discuss B12 injections. 3.   Preauth Venofer 4.   Preauth B12 5.   RTC weekly x 4 for B12 then monthly x 6. 6.   RTC weekly x 3 for Venofer. 7.   RTC in 8 weeks (when patient comes in for first monthly B12) for MD assessment, labs (CBC with diff,  ferritin, folate, B12- ensure labs drawn before B12 that  day).  I discussed the assessment and treatment plan with the patient.  The patient was provided an opportunity to ask questions and all were answered.  The patient agreed with the plan and demonstrated an understanding of the instructions.  The patient was advised to call back if the symptoms worsen or if the condition fails to improve as anticipated.  I provided 22 minutes of face-to-face time during this this encounter and > 50% was spent counseling as documented under my assessment and plan. An additional 20+ minutes were spent reviewing her chart (Epic and Care Everywhere) including notes, labs, and imaging studies.    Melissa C. Mike Gip, MD, PhD    11/25/2020, 12:04 PM  I, Mirian Mo Tufford, am acting as Education administrator for [employer]. Mike Gip, MD, PhD.  I, Melissa C. Mike Gip, MD, have reviewed the above documentation for accuracy and completeness, and I agree with the above.

## 2020-11-25 ENCOUNTER — Inpatient Hospital Stay: Payer: Managed Care, Other (non HMO) | Attending: Hematology and Oncology | Admitting: Hematology and Oncology

## 2020-11-25 ENCOUNTER — Encounter: Payer: Self-pay | Admitting: Hematology and Oncology

## 2020-11-25 ENCOUNTER — Inpatient Hospital Stay: Payer: Managed Care, Other (non HMO)

## 2020-11-25 ENCOUNTER — Other Ambulatory Visit: Payer: Self-pay

## 2020-11-25 VITALS — BP 118/89 | HR 81 | Temp 98.3°F | Resp 20 | Ht 67.0 in | Wt 205.0 lb

## 2020-11-25 DIAGNOSIS — G4700 Insomnia, unspecified: Secondary | ICD-10-CM | POA: Diagnosis not present

## 2020-11-25 DIAGNOSIS — Z803 Family history of malignant neoplasm of breast: Secondary | ICD-10-CM | POA: Insufficient documentation

## 2020-11-25 DIAGNOSIS — R61 Generalized hyperhidrosis: Secondary | ICD-10-CM | POA: Diagnosis not present

## 2020-11-25 DIAGNOSIS — I10 Essential (primary) hypertension: Secondary | ICD-10-CM | POA: Insufficient documentation

## 2020-11-25 DIAGNOSIS — E611 Iron deficiency: Secondary | ICD-10-CM | POA: Diagnosis not present

## 2020-11-25 DIAGNOSIS — R519 Headache, unspecified: Secondary | ICD-10-CM | POA: Insufficient documentation

## 2020-11-25 DIAGNOSIS — R531 Weakness: Secondary | ICD-10-CM | POA: Diagnosis not present

## 2020-11-25 DIAGNOSIS — R5383 Other fatigue: Secondary | ICD-10-CM | POA: Diagnosis not present

## 2020-11-25 DIAGNOSIS — G4730 Sleep apnea, unspecified: Secondary | ICD-10-CM | POA: Diagnosis not present

## 2020-11-25 DIAGNOSIS — Z9884 Bariatric surgery status: Secondary | ICD-10-CM | POA: Diagnosis not present

## 2020-11-25 DIAGNOSIS — R197 Diarrhea, unspecified: Secondary | ICD-10-CM | POA: Diagnosis not present

## 2020-11-25 DIAGNOSIS — K219 Gastro-esophageal reflux disease without esophagitis: Secondary | ICD-10-CM | POA: Insufficient documentation

## 2020-11-25 DIAGNOSIS — K625 Hemorrhage of anus and rectum: Secondary | ICD-10-CM | POA: Diagnosis not present

## 2020-11-25 DIAGNOSIS — E7800 Pure hypercholesterolemia, unspecified: Secondary | ICD-10-CM | POA: Insufficient documentation

## 2020-11-25 DIAGNOSIS — Z800 Family history of malignant neoplasm of digestive organs: Secondary | ICD-10-CM | POA: Diagnosis not present

## 2020-11-25 DIAGNOSIS — M797 Fibromyalgia: Secondary | ICD-10-CM | POA: Diagnosis not present

## 2020-11-25 DIAGNOSIS — Z79899 Other long term (current) drug therapy: Secondary | ICD-10-CM | POA: Insufficient documentation

## 2020-11-25 DIAGNOSIS — E538 Deficiency of other specified B group vitamins: Secondary | ICD-10-CM | POA: Insufficient documentation

## 2020-11-25 DIAGNOSIS — R42 Dizziness and giddiness: Secondary | ICD-10-CM | POA: Diagnosis not present

## 2020-11-25 DIAGNOSIS — D509 Iron deficiency anemia, unspecified: Secondary | ICD-10-CM

## 2020-11-25 DIAGNOSIS — K7581 Nonalcoholic steatohepatitis (NASH): Secondary | ICD-10-CM | POA: Diagnosis not present

## 2020-11-25 DIAGNOSIS — Z87891 Personal history of nicotine dependence: Secondary | ICD-10-CM | POA: Diagnosis not present

## 2020-11-25 DIAGNOSIS — Z9049 Acquired absence of other specified parts of digestive tract: Secondary | ICD-10-CM | POA: Diagnosis not present

## 2020-11-25 NOTE — Patient Instructions (Signed)
Iron Sucrose injection What is this medicine? IRON SUCROSE (AHY ern SOO krohs) is an iron complex. Iron is used to make healthy red blood cells, which carry oxygen and nutrients throughout the body. This medicine is used to treat iron deficiency anemia in people with chronic kidney disease. This medicine may be used for other purposes; ask your health care provider or pharmacist if you have questions. COMMON BRAND NAME(S): Venofer What should I tell my health care provider before I take this medicine? They need to know if you have any of these conditions:  anemia not caused by low iron levels  heart disease  high levels of iron in the blood  kidney disease  liver disease  an unusual or allergic reaction to iron, other medicines, foods, dyes, or preservatives  pregnant or trying to get pregnant  breast-feeding How should I use this medicine? This medicine is for infusion into a vein. It is given by a health care professional in a hospital or clinic setting. Talk to your pediatrician regarding the use of this medicine in children. While this drug may be prescribed for children as young as 2 years for selected conditions, precautions do apply. Overdosage: If you think you have taken too much of this medicine contact a poison control center or emergency room at once. NOTE: This medicine is only for you. Do not share this medicine with others. What if I miss a dose? It is important not to miss your dose. Call your doctor or health care professional if you are unable to keep an appointment. What may interact with this medicine? Do not take this medicine with any of the following medications:  deferoxamine  dimercaprol  other iron products This medicine may also interact with the following medications:  chloramphenicol  deferasirox This list may not describe all possible interactions. Give your health care provider a list of all the medicines, herbs, non-prescription drugs, or  dietary supplements you use. Also tell them if you smoke, drink alcohol, or use illegal drugs. Some items may interact with your medicine. What should I watch for while using this medicine? Visit your doctor or healthcare professional regularly. Tell your doctor or healthcare professional if your symptoms do not start to get better or if they get worse. You may need blood work done while you are taking this medicine. You may need to follow a special diet. Talk to your doctor. Foods that contain iron include: whole grains/cereals, dried fruits, beans, or peas, leafy green vegetables, and organ meats (liver, kidney). What side effects may I notice from receiving this medicine? Side effects that you should report to your doctor or health care professional as soon as possible:  allergic reactions like skin rash, itching or hives, swelling of the face, lips, or tongue  breathing problems  changes in blood pressure  cough  fast, irregular heartbeat  feeling faint or lightheaded, falls  fever or chills  flushing, sweating, or hot feelings  joint or muscle aches/pains  seizures  swelling of the ankles or feet  unusually weak or tired Side effects that usually do not require medical attention (report to your doctor or health care professional if they continue or are bothersome):  diarrhea  feeling achy  headache  irritation at site where injected  nausea, vomiting  stomach upset  tiredness This list may not describe all possible side effects. Call your doctor for medical advice about side effects. You may report side effects to FDA at 1-800-FDA-1088. Where should I keep  my medicine? This drug is given in a hospital or clinic and will not be stored at home. NOTE: This sheet is a summary. It may not cover all possible information. If you have questions about this medicine, talk to your doctor, pharmacist, or health care provider.  2021 Elsevier/Gold Standard (2011-05-27  17:14:35)   Vitamin B12 Deficiency Vitamin B12 deficiency occurs when the body does not have enough vitamin B12, which is an important vitamin. The body needs this vitamin:  To make red blood cells.  To make DNA. This is the genetic material inside cells.  To help the nerves work properly so they can carry messages from the brain to the body. Vitamin B12 deficiency can cause various health problems, such as a low red blood cell count (anemia) or nerve damage. What are the causes? This condition may be caused by:  Not eating enough foods that contain vitamin B12.  Not having enough stomach acid and digestive fluids to properly absorb vitamin B12 from the food that you eat.  Certain digestive system diseases that make it hard to absorb vitamin B12. These diseases include Crohn's disease, chronic pancreatitis, and cystic fibrosis.  A condition in which the body does not make enough of a protein (intrinsic factor), resulting in too few red blood cells (pernicious anemia).  Having a surgery in which part of the stomach or small intestine is removed.  Taking certain medicines that make it hard for the body to absorb vitamin B12. These medicines include: ? Heartburn medicines (antacids and proton pump inhibitors). ? Certain antibiotic medicines. ? Some medicines that are used to treat diabetes, tuberculosis, gout, or high cholesterol. What increases the risk? The following factors may make you more likely to develop a B12 deficiency:  Being older than age 59.  Eating a vegetarian or vegan diet, especially while you are pregnant.  Eating a poor diet while you are pregnant.  Taking certain medicines.  Having alcoholism. What are the signs or symptoms? In some cases, there are no symptoms of this condition. If the condition leads to anemia or nerve damage, various symptoms can occur, such as:  Weakness.  Fatigue.  Loss of appetite.  Weight loss.  Numbness or tingling in your  hands and feet.  Redness and burning of the tongue.  Confusion or memory problems.  Depression.  Sensory problems, such as color blindness, ringing in the ears, or loss of taste.  Diarrhea or constipation.  Trouble walking. If anemia is severe, symptoms can include:  Shortness of breath.  Dizziness.  Rapid heart rate (tachycardia). How is this diagnosed? This condition may be diagnosed with a blood test to measure the level of vitamin B12 in your blood. You may also have other tests, including:  A group of tests that measure certain characteristics of blood cells (complete blood count, CBC).  A blood test to measure intrinsic factor.  A procedure where a thin tube with a camera on the end is used to look into your stomach or intestines (endoscopy). Other tests may be needed to discover the cause of B12 deficiency. How is this treated? Treatment for this condition depends on the cause. This condition may be treated by:  Changing your eating and drinking habits, such as: ? Eating more foods that contain vitamin B12. ? Drinking less alcohol or no alcohol.  Getting vitamin B12 injections.  Taking vitamin B12 supplements. Your health care provider will tell you which dosage is best for you. Follow these instructions at home: Eating  and drinking  Eat lots of healthy foods that contain vitamin B12, including: ? Meats and poultry. This includes beef, pork, chicken, [place], and organ meats, such as liver. ? Seafood. This includes clams, rainbow trout, salmon, tuna, and haddock. ? Eggs. ? Cereal and dairy products that are fortified. This means that vitamin B12 has been added to the food. Check the label on the package to see if the food is fortified. The items listed above may not be a complete list of recommended foods and beverages. Contact a dietitian for more information.   General instructions  Get any injections that are prescribed by your health care provider.  Take  supplements only as told by your health care provider. Follow the directions carefully.  Do not drink alcohol if your health care provider tells you not to. In some cases, you may only be asked to limit alcohol use.  Keep all follow-up visits as told by your health care provider. This is important. Contact a health care provider if:  Your symptoms come back. Get help right away if you:  Develop shortness of breath.  Have a rapid heart rate.  Have chest pain.  Become dizzy or lose consciousness. Summary  Vitamin B12 deficiency occurs when the body does not have enough vitamin B12.  The main causes of vitamin B12 deficiency include dietary deficiency, digestive diseases, pernicious anemia, and having a surgery in which part of the stomach or small intestine is removed.  In some cases, there are no symptoms of this condition. If the condition leads to anemia or nerve damage, various symptoms can occur, such as weakness, shortness of breath, and numbness.  Treatment may include getting vitamin B12 injections or taking vitamin B12 supplements. Eat lots of healthy foods that contain vitamin B12. This information is not intended to replace advice given to you by your health care provider. Make sure you discuss any questions you have with your health care provider. Document Revised: 02/02/2019 Document Reviewed: 04/25/2018 Elsevier Patient Education  2021 [employer].

## 2020-11-26 DIAGNOSIS — D509 Iron deficiency anemia, unspecified: Secondary | ICD-10-CM | POA: Insufficient documentation

## 2020-11-30 DIAGNOSIS — E538 Deficiency of other specified B group vitamins: Secondary | ICD-10-CM | POA: Insufficient documentation

## 2020-12-02 ENCOUNTER — Inpatient Hospital Stay: Payer: Managed Care, Other (non HMO) | Attending: Hematology and Oncology

## 2020-12-02 ENCOUNTER — Other Ambulatory Visit: Payer: Self-pay

## 2020-12-02 VITALS — BP 118/78 | HR 76 | Temp 97.0°F | Resp 20

## 2020-12-02 DIAGNOSIS — D509 Iron deficiency anemia, unspecified: Secondary | ICD-10-CM | POA: Insufficient documentation

## 2020-12-02 DIAGNOSIS — E538 Deficiency of other specified B group vitamins: Secondary | ICD-10-CM | POA: Insufficient documentation

## 2020-12-02 MED ORDER — SODIUM CHLORIDE 0.9 % IV SOLN
Freq: Once | INTRAVENOUS | Status: AC
  Filled 2020-12-02: qty 250

## 2020-12-02 MED ORDER — CYANOCOBALAMIN 1000 MCG/ML IJ SOLN
1000.0000 ug | Freq: Once | INTRAMUSCULAR | Status: AC
  Administered 2020-12-02: 1000 ug via INTRAMUSCULAR
  Filled 2020-12-02: qty 1

## 2020-12-02 MED ORDER — IRON SUCROSE 20 MG/ML IV SOLN
200.0000 mg | Freq: Once | INTRAVENOUS | Status: AC
  Administered 2020-12-02: 200 mg via INTRAVENOUS
  Filled 2020-12-02: qty 10

## 2020-12-02 MED ORDER — SODIUM CHLORIDE 0.9 % IV SOLN: 200.0000 mg | Freq: Once | INTRAVENOUS | Status: DC

## 2020-12-03 ENCOUNTER — Other Ambulatory Visit: Payer: Self-pay | Admitting: Hematology and Oncology

## 2020-12-09 ENCOUNTER — Inpatient Hospital Stay: Payer: Managed Care, Other (non HMO)

## 2020-12-09 ENCOUNTER — Other Ambulatory Visit: Payer: Self-pay

## 2020-12-09 VITALS — BP 113/78 | HR 76 | Resp 18

## 2020-12-09 DIAGNOSIS — D509 Iron deficiency anemia, unspecified: Secondary | ICD-10-CM | POA: Diagnosis not present

## 2020-12-09 MED ORDER — SODIUM CHLORIDE 0.9 % IV SOLN: 200.0000 mg | Freq: Once | INTRAVENOUS | Status: DC

## 2020-12-09 MED ORDER — DIPHENHYDRAMINE HCL 25 MG PO CAPS: 50.0000 mg | ORAL_CAPSULE | Freq: Once | ORAL | Status: DC

## 2020-12-09 MED ORDER — CYANOCOBALAMIN 1000 MCG/ML IJ SOLN
1000.0000 ug | Freq: Once | INTRAMUSCULAR | Status: AC
  Administered 2020-12-09: 1000 ug via INTRAMUSCULAR
  Filled 2020-12-09: qty 1

## 2020-12-09 MED ORDER — ACETAMINOPHEN 325 MG PO TABS: 650.0000 mg | ORAL_TABLET | Freq: Once | ORAL | Status: DC

## 2020-12-09 MED ORDER — SODIUM CHLORIDE 0.9 % IV SOLN
Freq: Once | INTRAVENOUS | Status: AC
  Filled 2020-12-09: qty 250

## 2020-12-09 MED ORDER — IRON SUCROSE 20 MG/ML IV SOLN
200.0000 mg | Freq: Once | INTRAVENOUS | Status: AC
  Administered 2020-12-09: 200 mg via INTRAVENOUS
  Filled 2020-12-09: qty 10

## 2020-12-16 ENCOUNTER — Other Ambulatory Visit: Payer: Self-pay

## 2020-12-16 ENCOUNTER — Inpatient Hospital Stay: Payer: Managed Care, Other (non HMO)

## 2020-12-16 VITALS — BP 138/82 | HR 85 | Temp 97.9°F | Resp 18

## 2020-12-16 DIAGNOSIS — D509 Iron deficiency anemia, unspecified: Secondary | ICD-10-CM

## 2020-12-16 MED ORDER — SODIUM CHLORIDE 0.9 % IV SOLN: 200.0000 mg | Freq: Once | INTRAVENOUS | Status: DC

## 2020-12-16 MED ORDER — IRON SUCROSE 20 MG/ML IV SOLN
200.0000 mg | Freq: Once | INTRAVENOUS | Status: AC
  Administered 2020-12-16: 200 mg via INTRAVENOUS
  Filled 2020-12-16: qty 10

## 2020-12-16 MED ORDER — SODIUM CHLORIDE 0.9 % IV SOLN
Freq: Once | INTRAVENOUS | Status: AC
  Filled 2020-12-16: qty 250

## 2020-12-16 MED ORDER — CYANOCOBALAMIN 1000 MCG/ML IJ SOLN
1000.0000 ug | Freq: Once | INTRAMUSCULAR | Status: AC
  Administered 2020-12-16: 1000 ug via INTRAMUSCULAR
  Filled 2020-12-16: qty 1

## 2020-12-16 NOTE — Progress Notes (Signed)
Patient tolerated Vitamin B injection and Venofer infusion well today, no concerns voiced. Patient discharged, stable.

## 2020-12-23 ENCOUNTER — Other Ambulatory Visit: Payer: Self-pay

## 2020-12-23 ENCOUNTER — Inpatient Hospital Stay: Payer: Managed Care, Other (non HMO)

## 2020-12-23 DIAGNOSIS — D509 Iron deficiency anemia, unspecified: Secondary | ICD-10-CM | POA: Diagnosis not present

## 2020-12-23 MED ORDER — CYANOCOBALAMIN 1000 MCG/ML IJ SOLN
1000.0000 ug | Freq: Once | INTRAMUSCULAR | Status: AC
  Administered 2020-12-23: 1000 ug via INTRAMUSCULAR

## 2021-01-22 ENCOUNTER — Other Ambulatory Visit: Payer: Self-pay

## 2021-01-22 ENCOUNTER — Inpatient Hospital Stay: Payer: Managed Care, Other (non HMO) | Attending: Oncology

## 2021-01-22 DIAGNOSIS — D509 Iron deficiency anemia, unspecified: Secondary | ICD-10-CM | POA: Insufficient documentation

## 2021-01-22 DIAGNOSIS — E039 Hypothyroidism, unspecified: Secondary | ICD-10-CM | POA: Insufficient documentation

## 2021-01-22 MED ORDER — CYANOCOBALAMIN 1000 MCG/ML IJ SOLN
1000.0000 ug | Freq: Once | INTRAMUSCULAR | Status: AC
  Administered 2021-01-22: 1000 ug via INTRAMUSCULAR
  Filled 2021-01-22: qty 1

## 2021-01-22 NOTE — Patient Instructions (Signed)

## 2021-01-22 NOTE — Progress Notes (Signed)
Vitamin B12 injection administered to left arm as ordered, site benign.  Pt tolerated injection well and left the infusion suite stable and ambulatory.

## 2021-02-18 ENCOUNTER — Other Ambulatory Visit: Payer: Self-pay

## 2021-02-18 DIAGNOSIS — D509 Iron deficiency anemia, unspecified: Secondary | ICD-10-CM

## 2021-02-18 DIAGNOSIS — E538 Deficiency of other specified B group vitamins: Secondary | ICD-10-CM

## 2021-02-19 ENCOUNTER — Inpatient Hospital Stay (HOSPITAL_BASED_OUTPATIENT_CLINIC_OR_DEPARTMENT_OTHER): Payer: Managed Care, Other (non HMO) | Admitting: Oncology

## 2021-02-19 ENCOUNTER — Other Ambulatory Visit: Payer: Self-pay

## 2021-02-19 ENCOUNTER — Inpatient Hospital Stay: Payer: Managed Care, Other (non HMO)

## 2021-02-19 ENCOUNTER — Inpatient Hospital Stay: Payer: Managed Care, Other (non HMO) | Attending: Oncology

## 2021-02-19 VITALS — BP 106/76 | HR 84 | Temp 97.2°F | Resp 18 | Wt 213.5 lb

## 2021-02-19 DIAGNOSIS — D509 Iron deficiency anemia, unspecified: Secondary | ICD-10-CM

## 2021-02-19 DIAGNOSIS — Z9884 Bariatric surgery status: Secondary | ICD-10-CM | POA: Diagnosis not present

## 2021-02-19 DIAGNOSIS — E538 Deficiency of other specified B group vitamins: Secondary | ICD-10-CM | POA: Insufficient documentation

## 2021-02-19 LAB — CBC WITH DIFFERENTIAL/PLATELET
Abs Immature Granulocytes: 0.07 10*3/uL (ref 0.00–0.07)
Basophils Absolute: 0.1 10*3/uL (ref 0.0–0.1)
Basophils Relative: 1 %
Eosinophils Absolute: 0.2 10*3/uL (ref 0.0–0.5)
Eosinophils Relative: 3 %
HCT: 40 % (ref 36.0–46.0)
Hemoglobin: 13.1 g/dL (ref 12.0–15.0)
Immature Granulocytes: 1 %
Lymphocytes Relative: 36 %
Lymphs Abs: 2.9 10*3/uL (ref 0.7–4.0)
MCH: 29.1 pg (ref 26.0–34.0)
MCHC: 32.8 g/dL (ref 30.0–36.0)
MCV: 88.9 fL (ref 80.0–100.0)
Monocytes Absolute: 0.7 10*3/uL (ref 0.1–1.0)
Monocytes Relative: 8 %
Neutro Abs: 4.2 10*3/uL (ref 1.7–7.7)
Neutrophils Relative %: 51 %
Platelets: 317 10*3/uL (ref 150–400)
RBC: 4.5 MIL/uL (ref 3.87–5.11)
RDW: 15.9 % — ABNORMAL HIGH (ref 11.5–15.5)
WBC: 8.1 10*3/uL (ref 4.0–10.5)
nRBC: 0 % (ref 0.0–0.2)

## 2021-02-19 LAB — FERRITIN: Ferritin: 18 ng/mL (ref 11–307)

## 2021-02-19 LAB — FOLATE: Folate: 36 ng/mL (ref >5.9)

## 2021-02-19 LAB — VITAMIN B12: Vitamin B-12: 365 pg/mL (ref 180–914)

## 2021-02-19 MED ORDER — CYANOCOBALAMIN 1000 MCG/ML IJ SOLN
1000.0000 ug | Freq: Once | INTRAMUSCULAR | Status: AC
  Administered 2021-02-19: 1000 ug via INTRAMUSCULAR

## 2021-02-19 NOTE — Progress Notes (Signed)
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 She has been taking sublingual B12 since her surgery. She started taking oral iron once daily about 3 weeks ago. She had a total hysterectomy in 2012 due to endometriosis.  Labs on 11/11/2020 revealed a hematocrit of 39.2, hemoglobin 12.6, MCV 86.0, platelets 349,000, WBC 9,400. Ferritin was 5 with an iron saturation of 5% and a TIBC of 611. Vitamin B12 was 183 and folate 7.8.  Patient received IV Venofer treatments  Colonoscopy on 10/27/2018 revealed acute ileitis.  EGD on 02/20/2020 revealed a normal esophagus. Roux-en-Y gastrojejunostomy with gastrojejunal anastomosis characterized by healthy appearing mucosa. There was a normal examined jejunum. Pathology was unremarkable.  She has B12 deficiency.  B12 has been followed: 361 on 06/11/2016, 346 on 01/03/2017, 275 on 02/17/2018, 147 on 07/20/2019, and 183 on 11/11/2020.  Folate has ranged from 7.8 - 43.0. The patient has sleep apnea and is not using a CPAP machine. Colonoscopy on 10/27/2018 revealed acute ileitis.  Her father had colon polyps. Her mother had breast cancer and pancreatic cancer.   INTERVAL HISTORY Norma Riggs is a 45 y.o. female who has above history reviewed by me today presents for follow up visit for iron deficiency anemia and vitamin B12 deficiency. Problems and complaints are listed below: Patient reports feeling at her baseline.  No new  complaints. Not feeling well mentally.    Past Medical History:  Diagnosis Date   Asthma    Depression    Dysrhythmia    tachycardia   Endometriosis    Fibromyalgia    GERD (gastroesophageal reflux disease)    High cholesterol    Hypertension    Migraines    NASH (nonalcoholic steatohepatitis)    Sleep apnea    on c-pap    Past Surgical History:  Procedure Laterality Date   BREAST BIOPSY Right 2009   neg   ESOPHAGOGASTRODUODENOSCOPY N/A 02/20/2020   Procedure: ESOPHAGOGASTRODUODENOSCOPY (EGD);  Surgeon: Toledo, Benay Pike, MD;  Location: [facility] ENDOSCOPY;  Service: Gastroenterology;  Laterality: N/A;   GASTRIC BYPASS     TOTAL ABDOMINAL HYSTERECTOMY  2012   endometriosis    Family History  Problem Relation Age of Onset   Diabetes Mother    Breast cancer Mother 60   CAD Brother 22    Social History:  reports that she quit smoking about 5 years ago. Her smoking use included cigarettes. She has never used smokeless tobacco. She reports that she does not drink alcohol and does not use drugs. She is smoking a pack per day. She has quit in the past and is trying to quit again. She does not work outside of the home. She has  daughter and a son. The patient is alone today.  Allergies:  Allergies  Allergen Reactions   Gadolinium Derivatives Hives    Per pt she broke out in hives after receiving MRI contrast in 2010 (not CT contrast). MSY    Penicillins Nausea And Vomiting    Other reaction(s): Hypotension   Sulfa Antibiotics Itching  Current Medications: Current Outpatient Medications  Medication Sig Dispense Refill   buPROPion (WELLBUTRIN XL) 150 MG 24 hr tablet Take by mouth.     DULoxetine (CYMBALTA) 30 MG capsule Take by mouth.     hydrOXYzine (ATARAX/VISTARIL) 25 MG tablet Take 25 mg by mouth 3 (three) times daily as needed.     metoprolol tartrate (LOPRESSOR) 50 MG tablet daily as needed.     Multiple Vitamins-Minerals (HAIR SKIN & NAILS ADVANCED PO) Take by mouth  daily.     traZODone (DESYREL) 100 MG tablet Take 1 tablet by mouth at bedtime.     varenicline (CHANTIX PAK) 0.5 MG X 11 & 1 MG X 42 tablet Take by mouth as directed.     venlafaxine XR (EFFEXOR-XR) 150 MG 24 hr capsule Take 1 capsule by mouth daily.     albuterol (VENTOLIN HFA) 108 (90 Base) MCG/ACT inhaler Inhale into the lungs every 6 (six) hours as needed for wheezing or shortness of breath. (Patient not taking: No sig reported)     No current facility-administered medications for this visit.    Review of Systems  Constitutional:  Positive for malaise/fatigue. Negative for chills, diaphoresis, fever and weight loss.  HENT:  Negative for congestion, ear discharge, ear pain, hearing loss, nosebleeds, sinus pain, sore throat and tinnitus.        Gum bleeds.  Eyes:  Positive for photophobia. Negative for blurred vision.  Respiratory:  Negative for cough, hemoptysis, sputum production and shortness of breath.   Cardiovascular:  Negative for chest pain, palpitations and leg swelling.  Gastrointestinal:  Positive for diarrhea. Negative for abdominal pain, blood in stool, constipation, heartburn, melena, nausea and vomiting.  Genitourinary:  Negative for dysuria, frequency, hematuria and urgency.  Musculoskeletal:  Positive for myalgias (Fibromyalgia). Negative for back pain, joint pain and neck pain.  Skin:  Negative for itching and rash.       Petechiae, resolved  Neurological:  Positive for dizziness (when she stands up) and weakness. Negative for tingling, sensory change and headaches.       "mind fog"  Endo/Heme/Allergies:  Does not bruise/bleed easily.  Psychiatric/Behavioral:  Negative for depression and memory loss. The patient has insomnia. The patient is not nervous/anxious.        Tearful. "Not having a good day mentally."  All other systems reviewed and are negative. Performance status (ECOG): 1  Vitals Blood pressure 106/76, pulse 84, temperature (!) 97.2 F (36.2 C), resp.  rate 18, weight 213 lb 8.3 oz (96.9 kg), SpO2 97 %.   Physical Exam Vitals and nursing note reviewed.  Constitutional:      General: She is not in acute distress.    Appearance: She is not diaphoretic.  HENT:     Head: Normocephalic and atraumatic.     Mouth/Throat:     Mouth: Mucous membranes are moist.     Pharynx: Oropharynx is clear.  Eyes:     General: No scleral icterus.    Extraocular Movements: Extraocular movements intact.     Conjunctiva/sclera: Conjunctivae normal.     Pupils: Pupils are equal, round, and reactive to light.  Cardiovascular:     Rate and Rhythm: Normal rate and regular rhythm.     Heart sounds: Normal heart sounds. No murmur heard. Pulmonary:     Effort: Pulmonary effort is normal. No respiratory distress.     Breath sounds: Normal breath sounds. No wheezing or rales.  Chest:     Chest wall: No tenderness.  Breasts:  Right: No axillary adenopathy or supraclavicular adenopathy.     Left: No axillary adenopathy or supraclavicular adenopathy.  Abdominal:     General: Bowel sounds are normal. There is no distension.     Palpations: Abdomen is soft. There is no mass.     Tenderness: There is no abdominal tenderness. There is no guarding or rebound.  Musculoskeletal:        General: No swelling or tenderness. Normal range of motion.     Cervical back: Normal range of motion and neck supple.  Lymphadenopathy:     Head:     Right side of head: No preauricular, posterior auricular or occipital adenopathy.     Left side of head: No preauricular, posterior auricular or occipital adenopathy.     Cervical: No cervical adenopathy.     Upper Body:     Right upper body: No supraclavicular or axillary adenopathy.     Left upper body: No supraclavicular or axillary adenopathy.     Lower Body: No right inguinal adenopathy. No left inguinal adenopathy.  Skin:    General: Skin is warm and dry.  Neurological:     Mental Status: She is alert and oriented to  person, place, and time.  Psychiatric:        Thought Content: Thought content normal.        Judgment: Judgment normal.   Appointment on 02/19/2021  Component Date Value Ref Range Status   WBC 02/19/2021 8.1  4.0 - 10.5 K/uL Final   RBC 02/19/2021 4.50  3.87 - 5.11 MIL/uL Final   Hemoglobin 02/19/2021 13.1  12.0 - 15.0 g/dL Final   HCT 02/19/2021 40.0  36.0 - 46.0 % Final   MCV 02/19/2021 88.9  80.0 - 100.0 fL Final   MCH 02/19/2021 29.1  26.0 - 34.0 pg Final   MCHC 02/19/2021 32.8  30.0 - 36.0 g/dL Final   RDW 02/19/2021 15.9 (A) 11.5 - 15.5 % Final   Platelets 02/19/2021 317  150 - 400 K/uL Final   nRBC 02/19/2021 0.0  0.0 - 0.2 % Final   Neutrophils Relative % 02/19/2021 51  % Final   Neutro Abs 02/19/2021 4.2  1.7 - 7.7 K/uL Final   Lymphocytes Relative 02/19/2021 36  % Final   Lymphs Abs 02/19/2021 2.9  0.7 - 4.0 K/uL Final   Monocytes Relative 02/19/2021 8  % Final   Monocytes Absolute 02/19/2021 0.7  0.1 - 1.0 K/uL Final   Eosinophils Relative 02/19/2021 3  % Final   Eosinophils Absolute 02/19/2021 0.2  0.0 - 0.5 K/uL Final   Basophils Relative 02/19/2021 1  % Final   Basophils Absolute 02/19/2021 0.1  0.0 - 0.1 K/uL Final   Immature Granulocytes 02/19/2021 1  % Final   Abs Immature Granulocytes 02/19/2021 0.07  0.00 - 0.07 K/uL Final   Performed at [facility], [address] [place], [place] [address]   Ferritin 02/19/2021 18  11 - 307 ng/mL Final   Performed at [facility], [address] [place], [place] [address]   Folate 02/19/2021 36.0  >5.9 ng/mL Final   Performed at [facility], [address] [place], [place] [address]   Vitamin B-12 02/19/2021 365  180 - 914 pg/mL Final   Comment: (NOTE) This assay is not validated for testing neonatal or myeloproliferative syndrome specimens for Vitamin B12 levels. Performed at [facility], [place] [address] [place], [place] [address]     Assessment and plan  1. Iron deficiency  anemia, unspecified iron deficiency anemia type   2. B12 deficiency   3. History of gastric bypass     # Iron deficiency secondary to gastric bypass surgery. Iron panel was available after her visit. Ferritin at 18, improved., hemoglobin 13.1.  MCV 88.9. Hold IV Venofer treatment today.  #Vitamin B12 deficiency Vitamin B12 level is pending while patient is in the clinic.  Proceed with B12 injection today B12 level is at 365. Continue monthly vitamin B12 injections  RTC in November 2022, lab, CBC, CMP, iron TIBC ferritin, B12  I discussed the assessment and treatment plan with the patient.  The patient was provided an opportunity to ask questions and all were answered.  The patient agreed with the plan and demonstrated an understanding of the instructions.  The patient was advised to call back if the symptoms worsen or if the condition fails to improve as anticipated.   Earlie Server, MD, PhD Hematology Oncology [place] at [facility] 02/19/2021

## 2021-02-20 ENCOUNTER — Telehealth: Payer: Self-pay

## 2021-02-20 NOTE — Telephone Encounter (Signed)
-----   Message from Earlie Server, MD sent at 02/19/2021  8:33 PM EDT ----- Please let patient know that her vitamin B12 is 365 I recommend patient continue monthly B12.  Please arrange B12 monthly to her next visit.

## 2021-02-20 NOTE — Telephone Encounter (Signed)
Patient notified via [place].    Patient is scheduled for monthly B12 inj except for October.  Please schedule a B12 injection in October (November inj will be on the 12/1 appt) and cx the 12/6 B12 inj appt since she has appts on 12/1.

## 2021-02-23 ENCOUNTER — Encounter: Payer: Self-pay | Admitting: Hematology and Oncology

## 2021-02-23 ENCOUNTER — Inpatient Hospital Stay: Payer: Managed Care, Other (non HMO)

## 2021-03-25 ENCOUNTER — Inpatient Hospital Stay: Payer: Managed Care, Other (non HMO) | Attending: Hematology and Oncology

## 2021-03-25 ENCOUNTER — Other Ambulatory Visit: Payer: Self-pay

## 2021-03-25 DIAGNOSIS — D509 Iron deficiency anemia, unspecified: Secondary | ICD-10-CM

## 2021-03-25 DIAGNOSIS — E538 Deficiency of other specified B group vitamins: Secondary | ICD-10-CM | POA: Insufficient documentation

## 2021-03-25 MED ORDER — CYANOCOBALAMIN 1000 MCG/ML IJ SOLN
1000.0000 ug | Freq: Once | INTRAMUSCULAR | Status: AC
  Administered 2021-03-25: 1000 ug via INTRAMUSCULAR

## 2021-04-23 ENCOUNTER — Inpatient Hospital Stay: Payer: Managed Care, Other (non HMO) | Attending: Hematology and Oncology

## 2021-04-23 ENCOUNTER — Other Ambulatory Visit: Payer: Self-pay

## 2021-04-23 DIAGNOSIS — E538 Deficiency of other specified B group vitamins: Secondary | ICD-10-CM | POA: Insufficient documentation

## 2021-04-23 DIAGNOSIS — D509 Iron deficiency anemia, unspecified: Secondary | ICD-10-CM

## 2021-04-23 MED ORDER — CYANOCOBALAMIN 1000 MCG/ML IJ SOLN
1000.0000 ug | Freq: Once | INTRAMUSCULAR | Status: AC
  Administered 2021-04-23: 1000 ug via INTRAMUSCULAR

## 2021-05-26 ENCOUNTER — Inpatient Hospital Stay: Payer: Managed Care, Other (non HMO) | Attending: Hematology and Oncology

## 2021-05-26 ENCOUNTER — Other Ambulatory Visit: Payer: Self-pay

## 2021-05-26 DIAGNOSIS — E538 Deficiency of other specified B group vitamins: Secondary | ICD-10-CM | POA: Diagnosis not present

## 2021-05-26 DIAGNOSIS — D509 Iron deficiency anemia, unspecified: Secondary | ICD-10-CM

## 2021-05-26 MED ORDER — CYANOCOBALAMIN 1000 MCG/ML IJ SOLN
1000.0000 ug | Freq: Once | INTRAMUSCULAR | Status: AC
  Administered 2021-05-26: 1000 ug via INTRAMUSCULAR
  Filled 2021-05-26: qty 1

## 2021-06-18 ENCOUNTER — Other Ambulatory Visit: Payer: Self-pay

## 2021-06-18 ENCOUNTER — Ambulatory Visit
Admission: EM | Admit: 2021-06-18 | Discharge: 2021-06-18 | Disposition: A | Discharge status: Home / Self Care | Payer: Managed Care, Other (non HMO) | Attending: Internal Medicine | Admitting: Internal Medicine

## 2021-06-18 ENCOUNTER — Encounter: Payer: Self-pay | Admitting: Emergency Medicine

## 2021-06-18 ENCOUNTER — Telehealth: Payer: Self-pay | Admitting: Internal Medicine

## 2021-06-18 DIAGNOSIS — J4521 Mild intermittent asthma with (acute) exacerbation: Secondary | ICD-10-CM

## 2021-06-18 DIAGNOSIS — U071 COVID-19: Secondary | ICD-10-CM

## 2021-06-18 LAB — BASIC METABOLIC PANEL
Anion gap: 7 (ref 5–15)
BUN: 8 mg/dL (ref 6–20)
CO2: 27 mmol/L (ref 22–32)
Calcium: 9.5 mg/dL (ref 8.9–10.3)
Chloride: 106 mmol/L (ref 98–111)
Creatinine, Ser: 0.82 mg/dL (ref 0.44–1.00)
GFR, Estimated: >60 mL/min (ref >60)
Glucose, Bld: 101 mg/dL — ABNORMAL HIGH (ref 70–99)
Potassium: 4.2 mmol/L (ref 3.5–5.1)
Sodium: 140 mmol/L (ref 135–145)

## 2021-06-18 LAB — RESP PANEL BY RT-PCR (FLU A&B, COVID) ARPGX2
Influenza A by PCR: NEGATIVE
Influenza B by PCR: NEGATIVE
SARS Coronavirus 2 by RT PCR: POSITIVE — AB

## 2021-06-18 MED ORDER — BENZONATATE 100 MG PO CAPS: 100.0000 mg | ORAL_CAPSULE | Freq: Three times a day (TID) | ORAL | 0 refills | Status: DC | PRN

## 2021-06-18 MED ORDER — NIRMATRELVIR/RITONAVIR (PAXLOVID)TABLET: 3.0000 | ORAL_TABLET | Freq: Two times a day (BID) | ORAL | 0 refills | Status: DC

## 2021-06-18 MED ORDER — ALBUTEROL SULFATE HFA 108 (90 BASE) MCG/ACT IN AERS: 1.0000 | INHALATION_SPRAY | Freq: Four times a day (QID) | RESPIRATORY_TRACT | 0 refills | Status: AC | PRN

## 2021-06-18 MED ORDER — PREDNISONE 20 MG PO TABS: 20.0000 mg | ORAL_TABLET | Freq: Every day | ORAL | 0 refills | Status: AC

## 2021-06-18 MED ORDER — IBUPROFEN 600 MG PO TABS: 600.0000 mg | ORAL_TABLET | Freq: Three times a day (TID) | ORAL | 0 refills | Status: DC | PRN

## 2021-06-18 NOTE — Telephone Encounter (Signed)
Paxlovid has been sent to the pharmacy

## 2021-06-18 NOTE — Discharge Instructions (Addendum)
Please quarantine for 5 days starting today.  After 5 days (ending 06/23/2021) please wear mask for an additional 5 days. Maintain adequate hydration We will send antiviral medications once your kidney function results are available If you have worsening symptoms please return to urgent care to be reevaluated.

## 2021-06-18 NOTE — ED Triage Notes (Signed)
Pt presents today with c/o of headache, cough, SOB with weakness that began yesterday. Temp at home 101.9. No home Covid tests taken. She has taken Tylenol 1000 mg at 0700 a.m.

## 2021-06-18 NOTE — ED Provider Notes (Signed)
[facility]    CSN: [id number] Arrival date & time: 06/18/21  1012      History   Chief Complaint Chief Complaint  Patient presents with   Cough   Fever   Shortness of Breath    HPI Norma Riggs is a 45 y.o. female comes to the urgent care with 1 day history of fever, headache, generalized body aches and a cough.  Patient recently returned from a trunk show in [place].  Symptom onset was fairly sudden and has been persistent.  Patient has some chest tightness with wheezing.  She has not used her inhalers.  She has not tried any over-the-counter medication.  Patient is fully vaccinated against COVID-19 virus.  HPI  Past Medical History:  Diagnosis Date   Asthma    Depression    Dysrhythmia    tachycardia   Endometriosis    Fibromyalgia    GERD (gastroesophageal reflux disease)    High cholesterol    Hypertension    Migraines    NASH (nonalcoholic steatohepatitis)    Sleep apnea    on c-pap    Patient Active Problem List   Diagnosis Date Noted   B12 deficiency 11/30/2020   Iron deficiency anemia 11/26/2020   Diarrhea due to malabsorption 02/14/2020   Functional diarrhea 10/26/2018   Chronic sinusitis with recurrent bronchitis 09/11/2018   Eczema 12/10/2015   Adrenal adenoma 07/14/2015   Edema extremities 07/14/2015   Bloodgood disease 07/14/2015   Fibromyalgia 07/14/2015   Focal nodular hyperplasia of liver 07/14/2015   Acne inversa 07/14/2015   History of colon polyps 07/14/2015   Insomnia, persistent 07/14/2015   Headache, migraine, intractable 07/14/2015   Combined fat and carbohydrate induced hyperlipemia 07/14/2015   Elevated WBC count 07/14/2015   Gastroduodenal ulcer 07/14/2015   Borderline diabetes mellitus 07/14/2015   Difficulty sleeping 07/14/2015   Fast heart beat 07/14/2015   Tobacco use disorder, mild, in early remission 07/14/2015   Dyslipidemia 11/20/2013   Depression 11/19/2013   Anxiety 07/02/2013   Edema  04/13/2013    Past Surgical History:  Procedure Laterality Date   BREAST BIOPSY Right 2009   neg   ESOPHAGOGASTRODUODENOSCOPY N/A 02/20/2020   Procedure: ESOPHAGOGASTRODUODENOSCOPY (EGD);  Surgeon: Toledo, Benay Pike, MD;  Location: [facility] ENDOSCOPY;  Service: Gastroenterology;  Laterality: N/A;   GASTRIC BYPASS     TOTAL ABDOMINAL HYSTERECTOMY  2012   endometriosis    OB History   No obstetric history on file.      Home Medications    Prior to Admission medications   Medication Sig Start Date End Date Taking? Authorizing Provider  benzonatate (TESSALON) 100 MG capsule Take 1 capsule (100 mg total) by mouth 3 (three) times daily as needed for cough. 06/18/21  Yes Emiliano Welshans, Myrene Galas, MD  ibuprofen (ADVIL) 600 MG tablet Take 1 tablet (600 mg total) by mouth every 8 (eight) hours as needed. 06/18/21  Yes Suheily Birks, Myrene Galas, MD  predniSONE (DELTASONE) 20 MG tablet Take 1 tablet (20 mg total) by mouth daily for 5 doses. 06/18/21 06/23/21 Yes Tavi Hoogendoorn, Myrene Galas, MD  albuterol (VENTOLIN HFA) 108 (90 Base) MCG/ACT inhaler Inhale 1 puff into the lungs every 6 (six) hours as needed for wheezing or shortness of breath. 06/18/21   Chase Picket, MD  buPROPion (WELLBUTRIN XL) 150 MG 24 hr tablet Take by mouth. 02/04/21 02/04/22  [provider]  DULoxetine (CYMBALTA) 30 MG capsule Take by mouth. 11/19/13   [provider]  hydrOXYzine (ATARAX/VISTARIL)  25 MG tablet Take 25 mg by mouth 3 (three) times daily as needed. 02/05/21   [provider]  metoprolol tartrate (LOPRESSOR) 50 MG tablet daily as needed. 01/20/07   [provider]  Multiple Vitamins-Minerals (HAIR SKIN & NAILS ADVANCED PO) Take by mouth daily.    [provider]  traZODone (DESYREL) 100 MG tablet Take 1 tablet by mouth at bedtime. 08/02/14   [provider]  varenicline (CHANTIX PAK) 0.5 MG X 11 & 1 MG X 42 tablet Take by mouth as directed. 02/05/21   [provider]   venlafaxine XR (EFFEXOR-XR) 150 MG 24 hr capsule Take 1 capsule by mouth daily. 07/22/20 07/22/21  [provider]    Family History Family History  Problem Relation Age of Onset   Diabetes Mother    Breast cancer Mother 20   CAD Brother 53    Social History Social History   Tobacco Use   Smoking status: Former    Types: Cigarettes    Quit date: 06/19/2015    Years since quitting: 6.0   Smokeless tobacco: Never  Substance Use Topics   Alcohol use: No    Alcohol/week: 0.0 standard drinks   Drug use: Never     Allergies   Gadolinium derivatives, Penicillins, and Sulfa antibiotics   Review of Systems Review of Systems  Constitutional:  Positive for chills, fatigue and fever.  HENT:  Positive for congestion and sore throat.   Respiratory:  Positive for cough.   Gastrointestinal: Negative.   Musculoskeletal:  Positive for myalgias.  Neurological:  Positive for headaches.    Physical Exam Triage Vital Signs ED Triage Vitals  Enc Vitals Group     BP 06/18/21 1052 (!) 138/96     Pulse Rate 06/18/21 1052 (!) 101     Resp 06/18/21 1052 (!) 22     Temp 06/18/21 1052 (!) 100.7 F (38.2 C)     Temp Source 06/18/21 1052 Oral     SpO2 06/18/21 1052 97 %     Weight --      Height --      Head Circumference --      Peak Flow --      Pain Score 06/18/21 1050 5     Pain Loc --      Pain Edu? --      Excl. in [place]? --    No data found.  Updated Vital Signs BP (!) 138/96 (BP Location: Right Arm)   Pulse (!) 101   Temp (!) 100.7 F (38.2 C) (Oral)   Resp (!) 22   SpO2 97%   Visual Acuity Right Eye Distance:   Left Eye Distance:   Bilateral Distance:    Right Eye Near:   Left Eye Near:    Bilateral Near:     Physical Exam Vitals and nursing note reviewed.  Constitutional:      General: She is not in acute distress.    Appearance: She is ill-appearing.  Cardiovascular:     Rate and Rhythm: Normal rate and regular rhythm.  Pulmonary:     Effort:  Pulmonary effort is normal.     Breath sounds: Examination of the right-lower field reveals wheezing. Examination of the left-lower field reveals wheezing. Wheezing present. No decreased breath sounds, rhonchi or rales.  Neurological:     Mental Status: She is alert.     UC Treatments / Results  Labs (all labs ordered are listed, but only abnormal results are displayed)  Labs Reviewed  RESP PANEL BY RT-PCR (FLU A&B, COVID) ARPGX2 - Abnormal; Notable for the following components:      Result Value   SARS Coronavirus 2 by RT PCR POSITIVE (*)    All other components within normal limits  BASIC METABOLIC PANEL    EKG   Radiology No results found.  Procedures Procedures (including critical care time)  Medications Ordered in UC Medications - No data to display  Initial Impression / Assessment and Plan / UC Course  I have reviewed the triage vital signs and the nursing notes.  Pertinent labs & imaging results that were available during my care of the patient were reviewed by me and considered in my medical decision making (see chart for details).     COVID-19 infection: Basic metabolic panel If renal function is normal, patient will be started on Paxlovid Patient is advised to quarantine for 5 days starting today Maintain adequate hydration Mild intermittent asthma with acute exacerbation: Albuterol inhaler as needed Prednisone 20 mg orally daily for 5 days Tessalon Perles as needed for cough Ibuprofen as needed for generalized body aches Return to urgent care if you have worsening symptoms. Final Clinical Impressions(s) / UC Diagnoses   Final diagnoses:  [id number] virus infection  Mild intermittent asthma with acute exacerbation     Discharge Instructions      Please quarantine for 5 days starting today.  After 5 days (ending 06/23/2021) please wear mask for an additional 5 days. Maintain adequate hydration We will send antiviral medications once your kidney  function results are available If you have worsening symptoms please return to urgent care to be reevaluated.   ED Prescriptions     Medication Sig Dispense Auth. Provider   albuterol (VENTOLIN HFA) 108 (90 Base) MCG/ACT inhaler Inhale 1 puff into the lungs every 6 (six) hours as needed for wheezing or shortness of breath. 18 g Kierah Goatley, Myrene Galas, MD   predniSONE (DELTASONE) 20 MG tablet Take 1 tablet (20 mg total) by mouth daily for 5 doses. 5 tablet Blanca Carreon, Myrene Galas, MD   ibuprofen (ADVIL) 600 MG tablet Take 1 tablet (600 mg total) by mouth every 8 (eight) hours as needed. 30 tablet Rolen Conger, Myrene Galas, MD   benzonatate (TESSALON) 100 MG capsule Take 1 capsule (100 mg total) by mouth 3 (three) times daily as needed for cough. 21 capsule Tamryn Popko, Myrene Galas, MD      PDMP not reviewed this encounter.   Chase Picket, MD 06/18/21 [phone]

## 2021-06-19 ENCOUNTER — Telehealth: Payer: Self-pay | Admitting: Internal Medicine

## 2021-06-19 MED ORDER — NIRMATRELVIR/RITONAVIR (PAXLOVID)TABLET: 3.0000 | ORAL_TABLET | Freq: Two times a day (BID) | ORAL | 0 refills | Status: AC

## 2021-06-19 NOTE — Telephone Encounter (Signed)
Paxlovid resent to pharmacy

## 2021-06-24 ENCOUNTER — Ambulatory Visit: Payer: Managed Care, Other (non HMO)

## 2021-07-09 ENCOUNTER — Ambulatory Visit
Admission: RE | Admit: 2021-07-09 | Discharge: 2021-07-09 | Disposition: A | Discharge status: Home / Self Care | Payer: Managed Care, Other (non HMO) | Source: Ambulatory Visit | Attending: Family Medicine | Admitting: Family Medicine

## 2021-07-09 ENCOUNTER — Other Ambulatory Visit: Payer: Self-pay | Admitting: Family Medicine

## 2021-07-09 ENCOUNTER — Ambulatory Visit
Admission: RE | Admit: 2021-07-09 | Discharge: 2021-07-09 | Disposition: A | Discharge status: Home / Self Care | Payer: Managed Care, Other (non HMO) | Attending: Family Medicine | Admitting: Family Medicine

## 2021-07-09 ENCOUNTER — Other Ambulatory Visit: Payer: Self-pay

## 2021-07-09 DIAGNOSIS — R52 Pain, unspecified: Secondary | ICD-10-CM

## 2021-07-09 DIAGNOSIS — R609 Edema, unspecified: Secondary | ICD-10-CM | POA: Diagnosis present

## 2021-07-27 ENCOUNTER — Telehealth: Payer: Self-pay | Admitting: Oncology

## 2021-07-27 NOTE — Telephone Encounter (Signed)
Pt called to cancel her appt. Please give her a call back to reschedule at [phone]

## 2021-07-30 ENCOUNTER — Other Ambulatory Visit: Payer: Managed Care, Other (non HMO)

## 2021-07-30 ENCOUNTER — Ambulatory Visit: Payer: Managed Care, Other (non HMO)

## 2021-07-30 ENCOUNTER — Ambulatory Visit: Payer: Managed Care, Other (non HMO) | Admitting: Oncology

## 2021-08-04 ENCOUNTER — Ambulatory Visit: Payer: Managed Care, Other (non HMO)

## 2021-08-19 ENCOUNTER — Other Ambulatory Visit: Payer: Self-pay

## 2021-08-19 ENCOUNTER — Inpatient Hospital Stay: Payer: Managed Care, Other (non HMO) | Attending: Oncology

## 2021-08-19 DIAGNOSIS — E538 Deficiency of other specified B group vitamins: Secondary | ICD-10-CM | POA: Insufficient documentation

## 2021-08-19 DIAGNOSIS — D509 Iron deficiency anemia, unspecified: Secondary | ICD-10-CM | POA: Insufficient documentation

## 2021-08-19 LAB — COMPREHENSIVE METABOLIC PANEL
ALT: 19 U/L (ref 0–44)
AST: 23 U/L (ref 15–41)
Albumin: 3.9 g/dL (ref 3.5–5.0)
Alkaline Phosphatase: 104 U/L (ref 38–126)
Anion gap: 10 (ref 5–15)
BUN: 13 mg/dL (ref 6–20)
CO2: 26 mmol/L (ref 22–32)
Calcium: 9.3 mg/dL (ref 8.9–10.3)
Chloride: 100 mmol/L (ref 98–111)
Creatinine, Ser: 0.89 mg/dL (ref 0.44–1.00)
GFR, Estimated: >60 mL/min (ref >60)
Glucose, Bld: 89 mg/dL (ref 70–99)
Potassium: 4.1 mmol/L (ref 3.5–5.1)
Sodium: 136 mmol/L (ref 135–145)
Total Bilirubin: 0.2 mg/dL — ABNORMAL LOW (ref 0.3–1.2)
Total Protein: 7.4 g/dL (ref 6.5–8.1)

## 2021-08-19 LAB — CBC WITH DIFFERENTIAL/PLATELET
Abs Immature Granulocytes: 0.06 10*3/uL (ref 0.00–0.07)
Basophils Absolute: 0.1 10*3/uL (ref 0.0–0.1)
Basophils Relative: 1 %
Eosinophils Absolute: 0.3 10*3/uL (ref 0.0–0.5)
Eosinophils Relative: 3 %
HCT: 40.8 % (ref 36.0–46.0)
Hemoglobin: 13.5 g/dL (ref 12.0–15.0)
Immature Granulocytes: 1 %
Lymphocytes Relative: 43 %
Lymphs Abs: 4.1 10*3/uL — ABNORMAL HIGH (ref 0.7–4.0)
MCH: 29.2 pg (ref 26.0–34.0)
MCHC: 33.1 g/dL (ref 30.0–36.0)
MCV: 88.1 fL (ref 80.0–100.0)
Monocytes Absolute: 0.7 10*3/uL (ref 0.1–1.0)
Monocytes Relative: 8 %
Neutro Abs: 4.1 10*3/uL (ref 1.7–7.7)
Neutrophils Relative %: 44 %
Platelets: 291 10*3/uL (ref 150–400)
RBC: 4.63 MIL/uL (ref 3.87–5.11)
RDW: 12.5 % (ref 11.5–15.5)
WBC: 9.3 10*3/uL (ref 4.0–10.5)
nRBC: 0 % (ref 0.0–0.2)

## 2021-08-19 LAB — FERRITIN: Ferritin: 11 ng/mL (ref 11–307)

## 2021-08-19 LAB — IRON AND TIBC
Iron: 69 ug/dL (ref 28–170)
Saturation Ratios: 14 % (ref 10.4–31.8)
TIBC: 507 ug/dL — ABNORMAL HIGH (ref 250–450)
UIBC: 438 ug/dL

## 2021-08-19 LAB — VITAMIN B12: Vitamin B-12: 283 pg/mL (ref 180–914)

## 2021-08-20 ENCOUNTER — Inpatient Hospital Stay (HOSPITAL_BASED_OUTPATIENT_CLINIC_OR_DEPARTMENT_OTHER): Payer: Managed Care, Other (non HMO) | Admitting: Oncology

## 2021-08-20 ENCOUNTER — Encounter: Payer: Self-pay | Admitting: Oncology

## 2021-08-20 ENCOUNTER — Inpatient Hospital Stay: Payer: Managed Care, Other (non HMO)

## 2021-08-20 VITALS — BP 115/84 | HR 106 | Temp 97.3°F | Resp 18 | Wt 235.7 lb

## 2021-08-20 DIAGNOSIS — D509 Iron deficiency anemia, unspecified: Secondary | ICD-10-CM

## 2021-08-20 DIAGNOSIS — Z9884 Bariatric surgery status: Secondary | ICD-10-CM

## 2021-08-20 DIAGNOSIS — E538 Deficiency of other specified B group vitamins: Secondary | ICD-10-CM | POA: Diagnosis not present

## 2021-08-20 MED ORDER — SODIUM CHLORIDE 0.9 % IV SOLN
Freq: Once | INTRAVENOUS | Status: AC
  Filled 2021-08-20: qty 250

## 2021-08-20 MED ORDER — ACETAMINOPHEN 325 MG PO TABS
650.0000 mg | ORAL_TABLET | Freq: Once | ORAL | Status: AC
  Administered 2021-08-20: 14:00:00 650 mg via ORAL
  Filled 2021-08-20: qty 2

## 2021-08-20 MED ORDER — SODIUM CHLORIDE 0.9 % IV SOLN: 200.0000 mg | Freq: Once | INTRAVENOUS | Status: DC

## 2021-08-20 MED ORDER — DIPHENHYDRAMINE HCL 25 MG PO CAPS
50.0000 mg | ORAL_CAPSULE | Freq: Once | ORAL | Status: AC
  Administered 2021-08-20: 14:00:00 50 mg via ORAL
  Filled 2021-08-20: qty 2

## 2021-08-20 MED ORDER — IRON SUCROSE 20 MG/ML IV SOLN
200.0000 mg | Freq: Once | INTRAVENOUS | Status: AC
  Administered 2021-08-20: 14:00:00 200 mg via INTRAVENOUS
  Filled 2021-08-20: qty 10

## 2021-08-20 MED ORDER — CYANOCOBALAMIN 1000 MCG/ML IJ SOLN
1000.0000 ug | Freq: Once | INTRAMUSCULAR | Status: AC
  Administered 2021-08-20: 14:00:00 1000 ug via INTRAMUSCULAR
  Filled 2021-08-20: qty 1

## 2021-08-20 NOTE — Progress Notes (Signed)
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 She has been taking sublingual B12 since her surgery. She started taking oral iron once daily about 3 weeks ago. She had a total hysterectomy in 2012 due to endometriosis.  Labs on 11/11/2020 revealed a hematocrit of 39.2, hemoglobin 12.6, MCV 86.0, platelets 349,000, WBC 9,400. Ferritin was 5 with an iron saturation of 5% and a TIBC of 611. Vitamin B12 was 183 and folate 7.8.  Patient received IV Venofer treatments  Colonoscopy on 10/27/2018 revealed acute ileitis.  EGD on 02/20/2020 revealed a normal esophagus. Roux-en-Y gastrojejunostomy with gastrojejunal anastomosis characterized by healthy appearing mucosa. There was a normal examined jejunum. Pathology was unremarkable.  She has B12 deficiency.  B12 has been followed: 361 on 06/11/2016, 346 on 01/03/2017, 275 on 02/17/2018, 147 on 07/20/2019, and 183 on 11/11/2020.  Folate has ranged from 7.8 - 43.0. The patient has sleep apnea and is not using a CPAP machine. Colonoscopy on 10/27/2018 revealed acute ileitis.  Her father had colon polyps. Her mother had breast cancer and pancreatic cancer.   INTERVAL HISTORY Norma Riggs is a 45 y.o. female who has above history reviewed by me today presents for follow up visit for iron deficiency anemia and vitamin B12 deficiency. Chronic fatigue unchanged.  She has no new complaints today.   Past Medical History:  Diagnosis Date   Asthma    Depression    Dysrhythmia    tachycardia   Endometriosis     Fibromyalgia    GERD (gastroesophageal reflux disease)    High cholesterol    Hypertension    Migraines    NASH (nonalcoholic steatohepatitis)    Sleep apnea    on c-pap    Past Surgical History:  Procedure Laterality Date   BREAST BIOPSY Right 2009   neg   ESOPHAGOGASTRODUODENOSCOPY N/A 02/20/2020   Procedure: ESOPHAGOGASTRODUODENOSCOPY (EGD);  Surgeon: Toledo, Benay Pike, MD;  Location: [facility];  Service: Gastroenterology;  Laterality: N/A;   GASTRIC BYPASS     TOTAL ABDOMINAL HYSTERECTOMY  2012   endometriosis    Family History  Problem Relation Age of Onset   Diabetes Mother    Breast cancer Mother 55   CAD Brother 51    Social History:  reports that she quit smoking about 6 years ago. Her smoking use included cigarettes. She has never used smokeless tobacco. She reports that she does not drink alcohol and does not use drugs. She is smoking a pack per day. She has quit in the past and is trying to quit again. She does not work outside of the home. She has  daughter and a son. The patient is alone today.  Allergies:  Allergies  Allergen Reactions   Gadolinium Derivatives Hives    Per pt she broke out in hives after receiving MRI contrast in 2010 (not CT contrast). MSY    Penicillins Nausea And Vomiting    Other reaction(s): Hypotension   Sulfa Antibiotics Itching    Current Medications: Current Outpatient Medications  Medication Sig Dispense Refill   albuterol (VENTOLIN HFA) 108 (90 Base) MCG/ACT inhaler Inhale 1 puff into the lungs every  6 (six) hours as needed for wheezing or shortness of breath. 18 g 0   hydrOXYzine (ATARAX/VISTARIL) 25 MG tablet Take 25 mg by mouth 3 (three) times daily as needed.     Multiple Vitamins-Minerals (HAIR SKIN & NAILS ADVANCED PO) Take by mouth daily.     traZODone (DESYREL) 100 MG tablet Take 1 tablet by mouth at bedtime.     venlafaxine XR (EFFEXOR-XR) 150 MG 24 hr capsule Take 1 capsule by mouth daily.     No current  facility-administered medications for this visit.    Review of Systems  Constitutional:  Positive for malaise/fatigue. Negative for chills, diaphoresis, fever and weight loss.  HENT:  Negative for congestion, ear discharge, ear pain, hearing loss, nosebleeds, sinus pain, sore throat and tinnitus.   Eyes:  Positive for photophobia. Negative for blurred vision.  Respiratory:  Negative for cough, hemoptysis, sputum production and shortness of breath.   Cardiovascular:  Negative for chest pain, palpitations and leg swelling.  Gastrointestinal:  Positive for diarrhea. Negative for abdominal pain, blood in stool, constipation, heartburn, melena, nausea and vomiting.  Genitourinary:  Negative for dysuria, frequency, hematuria and urgency.  Musculoskeletal:  Positive for myalgias (Fibromyalgia). Negative for back pain, joint pain and neck pain.  Skin:  Negative for itching and rash.  Neurological:  Negative for tingling, sensory change and headaches.       "mind fog"  Endo/Heme/Allergies:  Does not bruise/bleed easily.  Psychiatric/Behavioral:  Negative for depression and memory loss. The patient has insomnia. The patient is not nervous/anxious.   All other systems reviewed and are negative. Performance status (ECOG): 1  Vitals Blood pressure 115/84, pulse (!) 106, temperature (!) 97.3 F (36.3 C), resp. rate 18, weight 235 lb 11.2 oz (106.9 kg).   Physical Exam Vitals and nursing note reviewed.  Constitutional:      General: She is not in acute distress.    Appearance: She is not diaphoretic.  HENT:     Head: Normocephalic and atraumatic.     Mouth/Throat:     Mouth: Mucous membranes are moist.     Pharynx: Oropharynx is clear.  Eyes:     General: No scleral icterus.    Extraocular Movements: Extraocular movements intact.     Conjunctiva/sclera: Conjunctivae normal.     Pupils: Pupils are equal, round, and reactive to light.  Cardiovascular:     Rate and Rhythm: Normal rate and  regular rhythm.     Heart sounds: Normal heart sounds. No murmur heard. Pulmonary:     Effort: Pulmonary effort is normal. No respiratory distress.     Breath sounds: Normal breath sounds. No wheezing or rales.  Chest:     Chest wall: No tenderness.  Abdominal:     General: Bowel sounds are normal. There is no distension.     Palpations: Abdomen is soft. There is no mass.     Tenderness: There is no abdominal tenderness. There is no guarding or rebound.  Musculoskeletal:        General: No swelling or tenderness. Normal range of motion.     Cervical back: Normal range of motion and neck supple.  Lymphadenopathy:     Head:     Right side of head: No preauricular, posterior auricular or occipital adenopathy.     Left side of head: No preauricular, posterior auricular or occipital adenopathy.     Cervical: No cervical adenopathy.     Upper Body:     Right upper body: No supraclavicular  or axillary adenopathy.     Left upper body: No supraclavicular or axillary adenopathy.     Lower Body: No right inguinal adenopathy. No left inguinal adenopathy.  Skin:    General: Skin is warm and dry.  Neurological:     Mental Status: She is alert and oriented to person, place, and time.  Psychiatric:        Thought Content: Thought content normal.        Judgment: Judgment normal.   Appointment on 08/19/2021  Component Date Value Ref Range Status   Vitamin B-12 08/19/2021 283  180 - 914 pg/mL Final   Comment: (NOTE) This assay is not validated for testing neonatal or myeloproliferative syndrome specimens for Vitamin B12 levels. Performed at [facility], [place] [address], [place], [place] [address]    Iron 08/19/2021 69  28 - 170 ug/dL Final   TIBC 08/19/2021 507 (H)  250 - 450 ug/dL Final   Saturation Ratios 08/19/2021 14  10.4 - 31.8 % Final   UIBC 08/19/2021 438  ug/dL Final   Performed at [facility], [address] [place], [place] [address]   WBC 08/19/2021 9.3  4.0 -  10.5 K/uL Final   RBC 08/19/2021 4.63  3.87 - 5.11 MIL/uL Final   Hemoglobin 08/19/2021 13.5  12.0 - 15.0 g/dL Final   HCT 08/19/2021 40.8  36.0 - 46.0 % Final   MCV 08/19/2021 88.1  80.0 - 100.0 fL Final   MCH 08/19/2021 29.2  26.0 - 34.0 pg Final   MCHC 08/19/2021 33.1  30.0 - 36.0 g/dL Final   RDW 08/19/2021 12.5  11.5 - 15.5 % Final   Platelets 08/19/2021 291  150 - 400 K/uL Final   nRBC 08/19/2021 0.0  0.0 - 0.2 % Final   Neutrophils Relative % 08/19/2021 44  % Final   Neutro Abs 08/19/2021 4.1  1.7 - 7.7 K/uL Final   Lymphocytes Relative 08/19/2021 43  % Final   Lymphs Abs 08/19/2021 4.1 (H)  0.7 - 4.0 K/uL Final   Monocytes Relative 08/19/2021 8  % Final   Monocytes Absolute 08/19/2021 0.7  0.1 - 1.0 K/uL Final   Eosinophils Relative 08/19/2021 3  % Final   Eosinophils Absolute 08/19/2021 0.3  0.0 - 0.5 K/uL Final   Basophils Relative 08/19/2021 1  % Final   Basophils Absolute 08/19/2021 0.1  0.0 - 0.1 K/uL Final   Immature Granulocytes 08/19/2021 1  % Final   Abs Immature Granulocytes 08/19/2021 0.06  0.00 - 0.07 K/uL Final   Performed at [facility], [address] [place], [place] [address]   Ferritin 08/19/2021 11  11 - 307 ng/mL Final   Performed at [facility], [address] [place], [place] [address]   Sodium 08/19/2021 136  135 - 145 mmol/L Final   Potassium 08/19/2021 4.1  3.5 - 5.1 mmol/L Final   Chloride 08/19/2021 100  98 - 111 mmol/L Final   CO2 08/19/2021 26  22 - 32 mmol/L Final   Glucose, Bld 08/19/2021 89  70 - 99 mg/dL Final   Glucose reference range applies only to samples taken after fasting for at least 8 hours.   BUN 08/19/2021 13  6 - 20 mg/dL Final   Creatinine, Ser 08/19/2021 0.89  0.44 - 1.00 mg/dL Final   Calcium 08/19/2021 9.3  8.9 - 10.3 mg/dL Final   Total Protein 08/19/2021 7.4  6.5 - 8.1 g/dL Final   Albumin 08/19/2021 3.9  3.5 - 5.0 g/dL Final   AST 08/19/2021 23  15 - 41 U/L Final   ALT 08/19/2021 19  0 - 44 U/L Final    Alkaline Phosphatase 08/19/2021 104  38 - 126 U/L Final   Total Bilirubin 08/19/2021 0.2 (L)  0.3 - 1.2 mg/dL Final   GFR, Estimated 08/19/2021 >60  >60 mL/min Final   Comment: (NOTE) Calculated using the CKD-EPI Creatinine Equation (2021)    Anion gap 08/19/2021 10  5 - 15 Final   Performed at [facility], [address], [place], [place] [address]    Assessment and plan 1. Iron deficiency anemia, unspecified iron deficiency anemia type   2. B12 deficiency   3. History of gastric bypass     # Iron deficiency secondary to gastric bypass surgery. Labs reviewed and discussed with patient. CBC showed normal hemoglobin.  Iron panel showed ferritin of 11, borderline low. Recommend IV Venofer 200 mg x 1 maintenance today.   #Vitamin B12 deficiency Vitamin B12 level is low normal end.  Continue monthly vitamin B12 injection.   RTC in 6 months.  Lab MD +/- Venofer +/- B12.  CBC CMP iron TIBC ferritin B12  I discussed the assessment and treatment plan with the patient.  The patient was provided an opportunity to ask questions and all were answered.  The patient agreed with the plan and demonstrated an understanding of the instructions.  The patient was advised to call back if the symptoms worsen or if the condition fails to improve as anticipated.   Earlie Server, MD, PhD Hematology Oncology [place] [facility] 08/20/2021

## 2021-08-20 NOTE — Patient Instructions (Signed)

## 2021-08-20 NOTE — Addendum Note (Signed)
Addended by: Earlie Server on: 08/20/2021 10:55 PM   Modules accepted: Orders

## 2021-08-20 NOTE — Progress Notes (Signed)
Pt here for follow up. No new concerns voiced.   

## 2022-01-14 ENCOUNTER — Other Ambulatory Visit (HOSPITAL_COMMUNITY): Payer: Self-pay | Admitting: Family Medicine

## 2022-01-14 ENCOUNTER — Other Ambulatory Visit: Payer: Self-pay | Admitting: Obstetrics and Gynecology

## 2022-01-14 ENCOUNTER — Other Ambulatory Visit (HOSPITAL_COMMUNITY): Payer: Self-pay | Admitting: Obstetrics and Gynecology

## 2022-01-14 ENCOUNTER — Other Ambulatory Visit: Payer: Self-pay | Admitting: Family Medicine

## 2022-01-14 DIAGNOSIS — Z1231 Encounter for screening mammogram for malignant neoplasm of breast: Secondary | ICD-10-CM

## 2022-01-14 DIAGNOSIS — D3501 Benign neoplasm of right adrenal gland: Secondary | ICD-10-CM

## 2022-01-14 DIAGNOSIS — E041 Nontoxic single thyroid nodule: Secondary | ICD-10-CM

## 2022-01-14 DIAGNOSIS — K769 Liver disease, unspecified: Secondary | ICD-10-CM

## 2022-01-21 ENCOUNTER — Ambulatory Visit: Payer: Managed Care, Other (non HMO)

## 2022-01-21 ENCOUNTER — Ambulatory Visit
Admission: RE | Admit: 2022-01-21 | Discharge: 2022-01-21 | Disposition: A | Discharge status: Home / Self Care | Payer: Managed Care, Other (non HMO) | Source: Ambulatory Visit | Attending: Family Medicine | Admitting: Family Medicine

## 2022-01-21 DIAGNOSIS — E041 Nontoxic single thyroid nodule: Secondary | ICD-10-CM | POA: Diagnosis present

## 2022-01-21 DIAGNOSIS — D3501 Benign neoplasm of right adrenal gland: Secondary | ICD-10-CM | POA: Insufficient documentation

## 2022-02-15 ENCOUNTER — Ambulatory Visit
Admission: RE | Admit: 2022-02-15 | Discharge: 2022-02-15 | Disposition: A | Discharge status: Home / Self Care | Payer: Managed Care, Other (non HMO) | Source: Ambulatory Visit | Attending: Family Medicine | Admitting: Family Medicine

## 2022-02-15 DIAGNOSIS — Z1231 Encounter for screening mammogram for malignant neoplasm of breast: Secondary | ICD-10-CM | POA: Diagnosis not present

## 2022-02-16 ENCOUNTER — Other Ambulatory Visit: Payer: Self-pay

## 2022-02-16 ENCOUNTER — Inpatient Hospital Stay: Payer: Managed Care, Other (non HMO) | Attending: Oncology

## 2022-02-16 DIAGNOSIS — E538 Deficiency of other specified B group vitamins: Secondary | ICD-10-CM | POA: Insufficient documentation

## 2022-02-16 DIAGNOSIS — Z9884 Bariatric surgery status: Secondary | ICD-10-CM | POA: Diagnosis not present

## 2022-02-16 DIAGNOSIS — D509 Iron deficiency anemia, unspecified: Secondary | ICD-10-CM

## 2022-02-16 LAB — FERRITIN: Ferritin: 6 ng/mL — ABNORMAL LOW (ref 11–307)

## 2022-02-16 LAB — IRON AND TIBC
Iron: 22 ug/dL — ABNORMAL LOW (ref 28–170)
Saturation Ratios: 4 % — ABNORMAL LOW (ref 10.4–31.8)
TIBC: 624 ug/dL — ABNORMAL HIGH (ref 250–450)
UIBC: 602 ug/dL

## 2022-02-16 LAB — CBC WITH DIFFERENTIAL/PLATELET
Abs Immature Granulocytes: 0.07 10*3/uL (ref 0.00–0.07)
Basophils Absolute: 0.1 10*3/uL (ref 0.0–0.1)
Basophils Relative: 1 %
Eosinophils Absolute: 0.3 10*3/uL (ref 0.0–0.5)
Eosinophils Relative: 3 %
HCT: 45.1 % (ref 36.0–46.0)
Hemoglobin: 14.3 g/dL (ref 12.0–15.0)
Immature Granulocytes: 1 %
Lymphocytes Relative: 39 %
Lymphs Abs: 4.2 10*3/uL — ABNORMAL HIGH (ref 0.7–4.0)
MCH: 29 pg (ref 26.0–34.0)
MCHC: 31.7 g/dL (ref 30.0–36.0)
MCV: 91.5 fL (ref 80.0–100.0)
Monocytes Absolute: 0.7 10*3/uL (ref 0.1–1.0)
Monocytes Relative: 6 %
Neutro Abs: 5.4 10*3/uL (ref 1.7–7.7)
Neutrophils Relative %: 50 %
Platelets: 332 10*3/uL (ref 150–400)
RBC: 4.93 MIL/uL (ref 3.87–5.11)
RDW: 13.8 % (ref 11.5–15.5)
WBC: 10.8 10*3/uL — ABNORMAL HIGH (ref 4.0–10.5)
nRBC: 0 % (ref 0.0–0.2)

## 2022-02-16 LAB — VITAMIN B12: Vitamin B-12: 148 pg/mL — ABNORMAL LOW (ref 180–914)

## 2022-02-18 ENCOUNTER — Inpatient Hospital Stay: Payer: Managed Care, Other (non HMO)

## 2022-02-18 ENCOUNTER — Encounter: Payer: Self-pay | Admitting: Oncology

## 2022-02-18 ENCOUNTER — Inpatient Hospital Stay (HOSPITAL_BASED_OUTPATIENT_CLINIC_OR_DEPARTMENT_OTHER): Payer: Managed Care, Other (non HMO) | Admitting: Oncology

## 2022-02-18 VITALS — BP 142/92 | HR 106 | Temp 97.3°F | Ht 67.0 in | Wt 225.0 lb

## 2022-02-18 DIAGNOSIS — Z9884 Bariatric surgery status: Secondary | ICD-10-CM

## 2022-02-18 DIAGNOSIS — D509 Iron deficiency anemia, unspecified: Secondary | ICD-10-CM

## 2022-02-18 DIAGNOSIS — E538 Deficiency of other specified B group vitamins: Secondary | ICD-10-CM

## 2022-02-18 MED ORDER — SODIUM CHLORIDE 0.9 % IV SOLN
Freq: Once | INTRAVENOUS | Status: AC
  Filled 2022-02-18: qty 250

## 2022-02-18 MED ORDER — IRON SUCROSE 20 MG/ML IV SOLN
200.0000 mg | Freq: Once | INTRAVENOUS | Status: AC
  Administered 2022-02-18: 200 mg via INTRAVENOUS

## 2022-02-18 MED ORDER — CYANOCOBALAMIN 1000 MCG/ML IJ SOLN
1000.0000 ug | Freq: Once | INTRAMUSCULAR | Status: AC
  Administered 2022-02-18: 1000 ug via INTRAMUSCULAR
  Filled 2022-02-18: qty 1

## 2022-02-18 MED ORDER — SODIUM CHLORIDE 0.9 % IV SOLN: 200.0000 mg | Freq: Once | INTRAVENOUS | Status: DC

## 2022-02-19 ENCOUNTER — Encounter: Payer: Self-pay | Admitting: Oncology

## 2022-02-19 DIAGNOSIS — Z9884 Bariatric surgery status: Secondary | ICD-10-CM | POA: Insufficient documentation

## 2022-02-19 NOTE — Assessment & Plan Note (Signed)
Labs reviewed and discussed with patient. CBC showed normal hemoglobin.  Iron panel showed ferritin of 6, iron saturation 4 Recommend IV Venofer 200 mg x 3

## 2022-02-19 NOTE — Assessment & Plan Note (Signed)
Mal-aborption could be the etiology of iron deficiency I recommend her follow up with gastroenterology for evaluation.

## 2022-03-11 ENCOUNTER — Inpatient Hospital Stay: Payer: Managed Care, Other (non HMO) | Attending: Oncology

## 2022-03-11 VITALS — BP 142/98

## 2022-03-11 DIAGNOSIS — D509 Iron deficiency anemia, unspecified: Secondary | ICD-10-CM | POA: Diagnosis not present

## 2022-03-11 DIAGNOSIS — E538 Deficiency of other specified B group vitamins: Secondary | ICD-10-CM | POA: Diagnosis not present

## 2022-03-11 MED ORDER — SODIUM CHLORIDE 0.9 % IV SOLN
Freq: Once | INTRAVENOUS | Status: AC
  Filled 2022-03-11: qty 250

## 2022-03-11 MED ORDER — SODIUM CHLORIDE 0.9 % IV SOLN
200.0000 mg | Freq: Once | INTRAVENOUS | Status: AC
  Administered 2022-03-11: 200 mg via INTRAVENOUS
  Filled 2022-03-11: qty 10

## 2022-03-11 NOTE — Patient Instructions (Signed)
[facility]CER [facility] AT [place]  Discharge Instructions: Thank you for choosing [facility] to provide your oncology and hematology care.  If you have a lab appointment with the [facility], please go directly to the [facility] and check in at the registration area.  Wear comfortable clothing and clothing appropriate for easy access to any Portacath or PICC line.   We strive to give you quality time with your provider. You may need to reschedule your appointment if you arrive late (15 or more minutes).  Arriving late affects you and other patients whose appointments are after yours.  Also, if you miss three or more appointments without notifying the office, you may be dismissed from the clinic at the provider's discretion.      For prescription refill requests, have your pharmacy contact our office and allow 72 hours for refills to be completed.    Today you received the following chemotherapy and/or immunotherapy agents VENOFER      To help prevent nausea and vomiting after your treatment, we encourage you to take your nausea medication as directed.  BELOW ARE SYMPTOMS THAT SHOULD BE REPORTED IMMEDIATELY: *FEVER GREATER THAN 100.4 F (38 C) OR HIGHER *CHILLS OR SWEATING *NAUSEA AND VOMITING THAT IS NOT CONTROLLED WITH YOUR NAUSEA MEDICATION *UNUSUAL SHORTNESS OF BREATH *UNUSUAL BRUISING OR BLEEDING *URINARY PROBLEMS (pain or burning when urinating, or frequent urination) *BOWEL PROBLEMS (unusual diarrhea, constipation, pain near the anus) TENDERNESS IN MOUTH AND THROAT WITH OR WITHOUT PRESENCE OF ULCERS (sore throat, sores in mouth, or a toothache) UNUSUAL RASH, SWELLING OR PAIN  UNUSUAL VAGINAL DISCHARGE OR ITCHING   Items with * indicate a potential emergency and should be followed up as soon as possible or go to the Emergency Department if any problems should occur.  Please show the CHEMOTHERAPY ALERT CARD or IMMUNOTHERAPY ALERT CARD at check-in to the  Emergency Department and triage nurse.  Should you have questions after your visit or need to cancel or reschedule your appointment, please contact [facility] CANCER [place] AT [place]  [phone] and follow the prompts.  Office hours are 8:00 a.m. to 4:30 p.m. Monday - Friday. Please note that voicemails left after 4:00 p.m. may not be returned until the following business day.  We are closed weekends and major holidays. You have access to a nurse at all times for urgent questions. Please call the main number to the clinic [phone] and follow the prompts.  For any non-urgent questions, you may also contact your provider using MyChart. We now offer e-Visits for anyone 58 and older to request care online for non-urgent symptoms. For details visit [email or web address].   Also download the MyChart app! Go to the app store, search "MyChart", open the app, select [facility], and log in with your MyChart username and password.  Masks are optional in the cancer centers. If you would like for your care team to wear a mask while they are taking care of you, please let them know. For doctor visits, patients may have with them one support person who is at least 44 years old. At this time, visitors are not allowed in the infusion area.  Iron Sucrose Injection What is this medication? IRON SUCROSE (EYE ern SOO krose) treats low levels of iron (iron deficiency anemia) in people with kidney disease. Iron is a mineral that plays an important role in making red blood cells, which carry oxygen from your lungs to the rest of your body. This medicine may be  used for other purposes; ask your health care provider or pharmacist if you have questions. COMMON BRAND NAME(S): Venofer What should I tell my care team before I take this medication? They need to know if you have any of these conditions: Anemia not caused by low iron levels Heart disease High levels of iron in the blood Kidney disease Liver  disease An unusual or allergic reaction to iron, other medications, foods, dyes, or preservatives Pregnant or trying to get pregnant Breast-feeding How should I use this medication? This medication is for infusion into a vein. It is given in a hospital or clinic setting. Talk to your care team about the use of this medication in children. While this medication may be prescribed for children as young as 2 years for selected conditions, precautions do apply. Overdosage: If you think you have taken too much of this medicine contact a poison control center or emergency room at once. NOTE: This medicine is only for you. Do not share this medicine with others. What if I miss a dose? It is important not to miss your dose. Call your care team if you are unable to keep an appointment. What may interact with this medication? Do not take this medication with any of the following: Deferoxamine Dimercaprol Other iron products This medication may also interact with the following: Chloramphenicol Deferasirox This list may not describe all possible interactions. Give your health care provider a list of all the medicines, herbs, non-prescription drugs, or dietary supplements you use. Also tell them if you smoke, drink alcohol, or use illegal drugs. Some items may interact with your medicine. What should I watch for while using this medication? Visit your care team regularly. Tell your care team if your symptoms do not start to get better or if they get worse. You may need blood work done while you are taking this medication. You may need to follow a special diet. Talk to your care team. Foods that contain iron include: whole grains/cereals, dried fruits, beans, or peas, leafy green vegetables, and organ meats (liver, kidney). What side effects may I notice from receiving this medication? Side effects that you should report to your care team as soon as possible: Allergic reactions--skin rash, itching, hives,  swelling of the face, lips, tongue, or throat Low blood pressure--dizziness, feeling faint or lightheaded, blurry vision Shortness of breath Side effects that usually do not require medical attention (report to your care team if they continue or are bothersome): Flushing Headache Joint pain Muscle pain Nausea Pain, redness, or irritation at injection site This list may not describe all possible side effects. Call your doctor for medical advice about side effects. You may report side effects to FDA at 1-800-FDA-1088. Where should I keep my medication? This medication is given in a hospital or clinic and will not be stored at home. NOTE: This sheet is a summary. It may not cover all possible information. If you have questions about this medicine, talk to your doctor, pharmacist, or health care provider.  2023 Elsevier/Gold Standard (2021-01-09 00:00:00)

## 2022-03-19 MED FILL — Iron Sucrose Inj 20 MG/ML (Fe Equiv): INTRAVENOUS | Qty: 10 | Status: AC

## 2022-03-22 ENCOUNTER — Inpatient Hospital Stay: Payer: Managed Care, Other (non HMO)

## 2022-03-22 DIAGNOSIS — D509 Iron deficiency anemia, unspecified: Secondary | ICD-10-CM

## 2022-03-22 MED ORDER — CYANOCOBALAMIN 1000 MCG/ML IJ SOLN
1000.0000 ug | Freq: Once | INTRAMUSCULAR | Status: AC
  Administered 2022-03-22: 1000 ug via INTRAMUSCULAR
  Filled 2022-03-22: qty 1

## 2022-03-23 ENCOUNTER — Telehealth: Payer: Self-pay

## 2022-03-23 ENCOUNTER — Encounter: Payer: Self-pay | Admitting: Oncology

## 2022-03-23 NOTE — Telephone Encounter (Signed)
Patient sent MyChart message with questions about her infusions. Her plan was infusions weekly x 3 and was only scheduled for 2.   [place], please schedule patient iron infusion this week or next and inform patient of appt. Thanks

## 2022-03-29 ENCOUNTER — Inpatient Hospital Stay: Payer: Managed Care, Other (non HMO)

## 2022-03-29 VITALS — BP 141/91 | HR 84 | Temp 97.5°F | Resp 16

## 2022-03-29 DIAGNOSIS — D509 Iron deficiency anemia, unspecified: Secondary | ICD-10-CM

## 2022-03-29 MED ORDER — SODIUM CHLORIDE 0.9 % IV SOLN
200.0000 mg | Freq: Once | INTRAVENOUS | Status: AC
  Administered 2022-03-29: 200 mg via INTRAVENOUS
  Filled 2022-03-29: qty 200

## 2022-03-29 MED ORDER — SODIUM CHLORIDE 0.9 % IV SOLN
Freq: Once | INTRAVENOUS | Status: AC
  Filled 2022-03-29: qty 250

## 2022-04-05 ENCOUNTER — Inpatient Hospital Stay: Payer: Managed Care, Other (non HMO) | Attending: Oncology

## 2022-04-05 VITALS — BP 131/93 | HR 87 | Temp 98.2°F | Resp 18

## 2022-04-05 DIAGNOSIS — D509 Iron deficiency anemia, unspecified: Secondary | ICD-10-CM | POA: Diagnosis not present

## 2022-04-05 DIAGNOSIS — E538 Deficiency of other specified B group vitamins: Secondary | ICD-10-CM | POA: Insufficient documentation

## 2022-04-05 MED ORDER — SODIUM CHLORIDE 0.9 % IV SOLN
Freq: Once | INTRAVENOUS | Status: AC
  Filled 2022-04-05: qty 250

## 2022-04-05 MED ORDER — SODIUM CHLORIDE 0.9 % IV SOLN
200.0000 mg | Freq: Once | INTRAVENOUS | Status: AC
  Administered 2022-04-05: 200 mg via INTRAVENOUS
  Filled 2022-04-05: qty 200

## 2022-04-05 NOTE — Patient Instructions (Signed)

## 2022-04-21 MED FILL — Iron Sucrose Inj 20 MG/ML (Fe Equiv): INTRAVENOUS | Qty: 10 | Status: AC

## 2022-04-22 ENCOUNTER — Inpatient Hospital Stay: Payer: Managed Care, Other (non HMO)

## 2022-04-22 DIAGNOSIS — D509 Iron deficiency anemia, unspecified: Secondary | ICD-10-CM

## 2022-04-22 MED ORDER — CYANOCOBALAMIN 1000 MCG/ML IJ SOLN
1000.0000 ug | Freq: Once | INTRAMUSCULAR | Status: AC
  Administered 2022-04-22: 1000 ug via INTRAMUSCULAR
  Filled 2022-04-22: qty 1

## 2022-05-24 ENCOUNTER — Inpatient Hospital Stay: Payer: Managed Care, Other (non HMO) | Attending: Oncology

## 2022-05-24 DIAGNOSIS — E538 Deficiency of other specified B group vitamins: Secondary | ICD-10-CM | POA: Diagnosis present

## 2022-05-24 DIAGNOSIS — D509 Iron deficiency anemia, unspecified: Secondary | ICD-10-CM

## 2022-05-24 MED ORDER — CYANOCOBALAMIN 1000 MCG/ML IJ SOLN
1000.0000 ug | Freq: Once | INTRAMUSCULAR | Status: AC
  Administered 2022-05-24: 1000 ug via INTRAMUSCULAR
  Filled 2022-05-24: qty 1

## 2022-06-23 ENCOUNTER — Inpatient Hospital Stay: Payer: Managed Care, Other (non HMO)

## 2022-07-26 ENCOUNTER — Inpatient Hospital Stay: Payer: Managed Care, Other (non HMO)

## 2022-08-18 ENCOUNTER — Inpatient Hospital Stay: Payer: Managed Care, Other (non HMO)

## 2022-08-18 ENCOUNTER — Telehealth: Payer: Self-pay | Admitting: *Deleted

## 2022-08-18 ENCOUNTER — Encounter: Payer: Self-pay | Admitting: Oncology

## 2022-08-18 MED FILL — Iron Sucrose Inj 20 MG/ML (Fe Equiv): INTRAVENOUS | Qty: 10 | Status: AC

## 2022-08-18 NOTE — Telephone Encounter (Signed)
CAll returned to patient and informed she can cancel lab appointment today, but per Dr Tasia Catchings she needs to kepp appointments fortomorrow to see doctor and Iron infusion. Patient in agreement with this

## 2022-08-18 NOTE — Telephone Encounter (Signed)
Patient called reporting that she had labs drawn at PCP office 12/18 and has lab appointment here today at 1115 and is asking if that and her appointment for tomorrow with Dr Tasia Catchings and Iron infusion can be postponed as her results don't look bad. Please advise Vitamin B12 Order: [id number]  Ref Range & Units 2 d ago  Vitamin B12 180 - 914 pg/mL 182  Complete Blood Count (CBC) Order: [id number]  Ref Range & Units 2 d ago  WBC (White Blood Cell Count) 3.2 - 9.8 x10^9/L 10.6 High   Hemoglobin 11.7 - 15.5 g/dL 15.5  Hematocrit 35.0 - 45.0 % 49.0 High   Platelets 150 - 450 x10^9/L 255  MCV (Mean Corpuscular Volume) 80 - 98 fL 100 High   MCH (Mean Corpuscular Hemoglobin) 26.5 - 34.0 pg 31.8  MCHC (Mean Corpuscular Hemoglobin Concentration) 31.0 - 36.0 % 31.6  RBC (Red Blood Cell Count) 3.77 - 5.16 x10^12/L 4.88  RDW-CV (Red Cell Distribution Width) 11.5 - 14.5 % 13.2  NRBC (Nucleated Red Blood Cell Count) 0 x10^9/L 0.00  NRBC % (Nucleated Red Blood Cell %) % 0.0  MPV (Mean Platelet Volume) 7.2 - 11.7 fL 10.9  Comprehensive Metabolic Panel (CMP) Order: [id number]  Ref Range & Units 2 d ago  Sodium 135 - 145 mmol/L 140  Potassium 3.5 - 5.0 mmol/L 5.3 High   Chloride 98 - 108 mmol/L 108  Carbon Dioxide (CO2) 21 - 30 mmol/L 26  Urea Nitrogen (BUN) 7 - 20 mg/dL 14  Creatinine 0.4 - 1.0 mg/dL 0.9  Glucose 70 - 140 mg/dL 96  Comment: Interpretive Data: Above is the NONFASTING reference range.  Below are the FASTING reference ranges: NORMAL:      70-99 mg/dL PREDIABETES: 100-125 mg/dL DIABETES:    > 125 mg/dL  Calcium 8.7 - 10.2 mg/dL 8.7  AST (Aspartate Aminotransferase) 15 - 41 U/L 26  ALT (Alanine Aminotransferase) 10 - 39 U/L 17  Bilirubin, Total 0.4 - 1.5 mg/dL 0.4  Alk Phos (Alkaline Phosphatase) 24 - 110 U/L 80  Albumin 3.5 - 4.8 g/dL 3.5  Protein, Total 6.2 - 8.1 g/dL 6.9  Anion Gap 3 - 12 mmol/L 6  BUN/CREA Ratio 6 - 27 16  Glomerular Filtration Rate (eGFR) mL/min/1.73sq m 81   Iron and Total Iron Binding Capacity (TIBC) Order: [id number]  Ref Range & Units 2 d ago  Iron 28 - 170 g/dL 86  Total Iron Binding Capacity (TIBC) 261 - 478 g/dL 494 High   Percent Transferrin Saturation 15 - 55 % 17  Ferritin Order: [id number]  Ref Range & Units 2 d ago  Ferritin 11 - 204 ng/mL 23

## 2022-08-19 ENCOUNTER — Inpatient Hospital Stay: Payer: Managed Care, Other (non HMO) | Attending: Oncology | Admitting: Oncology

## 2022-08-19 ENCOUNTER — Encounter: Payer: Self-pay | Admitting: Oncology

## 2022-08-19 ENCOUNTER — Inpatient Hospital Stay: Payer: Managed Care, Other (non HMO)

## 2022-08-19 VITALS — BP 149/102 | HR 110 | Temp 98.7°F | Wt 241.9 lb

## 2022-08-19 VITALS — BP 148/105 | HR 91

## 2022-08-19 DIAGNOSIS — E538 Deficiency of other specified B group vitamins: Secondary | ICD-10-CM | POA: Insufficient documentation

## 2022-08-19 DIAGNOSIS — Z9884 Bariatric surgery status: Secondary | ICD-10-CM | POA: Diagnosis not present

## 2022-08-19 DIAGNOSIS — D509 Iron deficiency anemia, unspecified: Secondary | ICD-10-CM

## 2022-08-19 DIAGNOSIS — I10 Essential (primary) hypertension: Secondary | ICD-10-CM | POA: Diagnosis not present

## 2022-08-19 DIAGNOSIS — D508 Other iron deficiency anemias: Secondary | ICD-10-CM

## 2022-08-19 MED ORDER — SODIUM CHLORIDE 0.9 % IV SOLN
Freq: Once | INTRAVENOUS | Status: AC
  Filled 2022-08-19: qty 250

## 2022-08-19 MED ORDER — SODIUM CHLORIDE 0.9 % IV SOLN
200.0000 mg | Freq: Once | INTRAVENOUS | Status: AC
  Administered 2022-08-19: 200 mg via INTRAVENOUS
  Filled 2022-08-19: qty 200

## 2022-08-19 MED ORDER — CYANOCOBALAMIN 1000 MCG/ML IJ SOLN
1000.0000 ug | Freq: Once | INTRAMUSCULAR | Status: AC
  Administered 2022-08-19: 1000 ug via INTRAMUSCULAR
  Filled 2022-08-19: qty 1

## 2022-08-19 NOTE — Assessment & Plan Note (Addendum)
B12 level is 182, recommend goal of Vitamin B12 >400.  I recommend monthly B12 IM 1046mg injection. She prefers to get injections via PCP's office.

## 2022-08-19 NOTE — Progress Notes (Signed)
Hematology/Oncology Progress note [phone] [phone]    ASSESSMENT & PLAN:   Iron deficiency anemia Labs reviewed and discussed with patient. CBC showed normal hemoglobin.  Iron panel showed ferritin of 23,  iron saturation 17, TIBC elevated.  Recommend maintenance IV venofer 275m x 1 in the setting of gastric bypass   B12 deficiency B12 level is 182, recommend goal of Vitamin B12 >400.  I recommend monthly B12 IM 10055m injection. She prefers to get injections via PCP's office.   History of gastric bypass Mal-aborption could be the etiology of iron deficiency I recommend her follow up with gastroenterology   Orders Placed This Encounter  Procedures   CBC with Differential/Platelet    Standing Status:   Future    Standing Expiration Date:   08/20/2023   Comprehensive metabolic panel    Standing Status:   Future    Standing Expiration Date:   08/20/2023   Vitamin B12    Standing Status:   Future    Standing Expiration Date:   08/20/2023   Folate    Standing Status:   Future    Standing Expiration Date:   08/20/2023   Iron and TIBC    Standing Status:   Future    Standing Expiration Date:   08/20/2023   Ferritin    Standing Status:   Future    Standing Expiration Date:   08/20/2023   Follow up  Lab in 12 months, prior to MD + Venofer + B12 - iron labs + B12  All questions were answered. The patient knows to call the clinic with any problems, questions or concerns.  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 She has been taking sublingual B12 since her  surgery. She started taking oral iron once daily about 3 weeks ago. She had a total hysterectomy in 2012 due to endometriosis.  Labs on 11/11/2020 revealed a hematocrit of 39.2, hemoglobin 12.6, MCV 86.0, platelets 349,000, WBC 9,400. Ferritin was 5 with an iron saturation of 5% and a TIBC of 611. Vitamin B12 was 183 and folate 7.8.  Patient received IV Venofer treatments  Colonoscopy on 10/27/2018 revealed acute ileitis.  EGD on 02/20/2020 revealed a normal esophagus. Roux-en-Y gastrojejunostomy with gastrojejunal anastomosis characterized by healthy appearing mucosa. There was a normal examined jejunum. Pathology was unremarkable.  She has B12 deficiency.  B12 has been followed: 361 on 06/11/2016, 346 on 01/03/2017, 275 on 02/17/2018, 147 on 07/20/2019, and 183 on 11/11/2020.  Folate has ranged from 7.8 - 43.0. The patient has sleep apnea and is not using a CPAP machine. Colonoscopy on 10/27/2018 revealed acute ileitis.  Her father had colon polyps. Her mother had breast cancer and pancreatic cancer.   INTERVAL HISTORY Taeko DaZenab Gronewolds a [age].o. female who has above history reviewed by me today presents for follow up visit for iron deficiency anemia and vitamin B12 deficiency. She feels well. No new complaints.   Past Medical History:  Diagnosis Date   Asthma    Depression    Dysrhythmia    tachycardia   Endometriosis    Fibromyalgia    GERD (gastroesophageal reflux disease)    High  cholesterol    Hypertension    Migraines    NASH (nonalcoholic steatohepatitis)    Sleep apnea    on c-pap    Past Surgical History:  Procedure Laterality Date   BREAST BIOPSY Left 2009   neg   BREAST EXCISIONAL BIOPSY Left 2009   ESOPHAGOGASTRODUODENOSCOPY N/A 02/20/2020   Procedure: ESOPHAGOGASTRODUODENOSCOPY (EGD);  Surgeon: Toledo, Benay Pike, MD;  Location: [facility] ENDOSCOPY;  Service: Gastroenterology;  Laterality: N/A;   GASTRIC BYPASS     TOTAL ABDOMINAL HYSTERECTOMY  2012    endometriosis    Family History  Problem Relation Age of Onset   Diabetes Mother    Breast cancer Mother 8   CAD Brother 21    Social History:  reports that she quit smoking about 7 years ago. Her smoking use included cigarettes. She has never used smokeless tobacco. She reports that she does not drink alcohol and does not use drugs.  Allergies:  Allergies  Allergen Reactions   Gadolinium Derivatives Hives    Per pt she broke out in hives after receiving MRI contrast in 2010 (not CT contrast). MSY    Penicillins Nausea And Vomiting    Other reaction(s): Hypotension   Sulfa Antibiotics Itching    Current Medications: Current Outpatient Medications  Medication Sig Dispense Refill   albuterol (VENTOLIN HFA) 108 (90 Base) MCG/ACT inhaler Inhale 1 puff into the lungs every 6 (six) hours as needed for wheezing or shortness of breath. 18 g 0   colestipol (COLESTID) 1 g tablet Take by mouth.     estradiol (VIVELLE-DOT) 0.1 MG/24HR patch Place 1 patch onto the skin 2 (two) times a week.     hydrOXYzine (ATARAX/VISTARIL) 25 MG tablet Take 25 mg by mouth 3 (three) times daily as needed.     Multiple Vitamins-Minerals (HAIR SKIN & NAILS ADVANCED PO) Take by mouth daily.     traZODone (DESYREL) 100 MG tablet Take 1 tablet by mouth at bedtime.     venlafaxine XR (EFFEXOR-XR) 150 MG 24 hr capsule Take 1 capsule by mouth daily.     buPROPion (WELLBUTRIN XL) 150 MG 24 hr tablet Take by mouth. (Patient not taking: Reported on 08/19/2022)     No current facility-administered medications for this visit.   Review of Systems  Constitutional:  Positive for fatigue. Negative for appetite change, chills and fever.  HENT:   Negative for hearing loss and voice change.   Eyes:  Negative for eye problems.  Respiratory:  Negative for chest tightness and cough.   Cardiovascular:  Negative for chest pain.  Gastrointestinal:  Positive for diarrhea. Negative for abdominal distention, abdominal pain and blood  in stool.  Endocrine: Negative for hot flashes.  Genitourinary:  Negative for difficulty urinating and frequency.   Musculoskeletal:  Negative for arthralgias.       Fibromyalgia  Skin:  Negative for itching and rash.  Neurological:  Negative for extremity weakness.  Hematological:  Negative for adenopathy.  Psychiatric/Behavioral:  Negative for confusion.      Performance status (ECOG): 1  Vitals Blood pressure (!) 149/102, pulse (!) 110, temperature 98.7 F (37.1 C), temperature source Tympanic, weight 241 lb 14.4 oz (109.7 kg), SpO2 94 %.   Physical Exam Constitutional:      General: She is not in acute distress.    Appearance: Normal appearance.  HENT:     Nose: Nose normal.     Mouth/Throat:     Pharynx: No oropharyngeal exudate.  Eyes:  Pupils: Pupils are equal, round, and reactive to light.  Cardiovascular:     Rate and Rhythm: Normal rate and regular rhythm.     Heart sounds: No murmur heard. Pulmonary:     Effort: Pulmonary effort is normal. No respiratory distress.     Breath sounds: Normal breath sounds.  Abdominal:     General: There is no distension.  Musculoskeletal:        General: Normal range of motion.     Cervical back: Normal range of motion and neck supple.  Skin:    General: Skin is warm and dry.  Neurological:     Mental Status: She is alert and oriented to person, place, and time. Mental status is at baseline.     Cranial Nerves: No cranial nerve deficit.     Motor: No abnormal muscle tone.  Psychiatric:        Mood and Affect: Mood and affect normal.    Labs are reviewed and discussed with patient.    Latest Ref Rng & Units 02/16/2022    1:00 PM 08/19/2021   12:59 PM 02/19/2021    9:00 AM  CBC  WBC 4.0 - 10.5 K/uL 10.8  9.3  8.1   Hemoglobin 12.0 - 15.0 g/dL 14.3  13.5  13.1   Hematocrit 36.0 - 46.0 % 45.1  40.8  40.0   Platelets 150 - 400 K/uL 332  291  317       Latest Ref Rng & Units 08/19/2021   12:59 PM 06/18/2021   11:51  AM 12/22/2014   10:24 PM  CMP  Glucose 70 - 99 mg/dL 89  101  181   BUN 6 - 20 mg/dL 13  8  20    Creatinine 0.44 - 1.00 mg/dL 0.89  0.82  1.13   Sodium 135 - 145 mmol/L 136  140  140   Potassium 3.5 - 5.1 mmol/L 4.1  4.2  3.2   Chloride 98 - 111 mmol/L 100  106  101   CO2 22 - 32 mmol/L 26  27  28    Calcium 8.9 - 10.3 mg/dL 9.3  9.5  9.5   Total Protein 6.5 - 8.1 g/dL 7.4   7.5   Total Bilirubin 0.3 - 1.2 mg/dL 0.2   <0.1   Alkaline Phos 38 - 126 U/L 104   80   AST 15 - 41 U/L 23   23   ALT 0 - 44 U/L 19   33    Iron/TIBC/Ferritin/ %Sat    Component Value Date/Time   IRON 22 (L) 02/16/2022 1300   TIBC 624 (H) 02/16/2022 1300   FERRITIN 6 (L) 02/16/2022 1300   IRONPCTSAT 4 (L) 02/16/2022 1300

## 2022-08-19 NOTE — Assessment & Plan Note (Signed)
Labs reviewed and discussed with patient. CBC showed normal hemoglobin.  Iron panel showed ferritin of 23,  iron saturation 17, TIBC elevated.  Recommend maintenance IV venofer 230m x 1 in the setting of gastric bypass

## 2022-08-19 NOTE — Progress Notes (Signed)
Patient declined to stay the 30 minutes post iron infusion. Patient educated on  side effects.

## 2022-08-21 ENCOUNTER — Encounter: Payer: Self-pay | Admitting: Oncology

## 2022-08-21 NOTE — Assessment & Plan Note (Signed)
Mal-aborption could be the etiology of iron deficiency I recommend her follow up with gastroenterology

## 2022-09-10 ENCOUNTER — Emergency Department
Admission: EM | Admit: 2022-09-10 | Discharge: 2022-09-10 | Disposition: A | Discharge status: Home / Self Care | Payer: Managed Care, Other (non HMO) | Attending: Emergency Medicine | Admitting: Emergency Medicine

## 2022-09-10 ENCOUNTER — Emergency Department: Payer: Managed Care, Other (non HMO)

## 2022-09-10 ENCOUNTER — Other Ambulatory Visit: Payer: Self-pay

## 2022-09-10 DIAGNOSIS — I10 Essential (primary) hypertension: Secondary | ICD-10-CM | POA: Insufficient documentation

## 2022-09-10 DIAGNOSIS — D72829 Elevated white blood cell count, unspecified: Secondary | ICD-10-CM | POA: Diagnosis not present

## 2022-09-10 DIAGNOSIS — R109 Unspecified abdominal pain: Secondary | ICD-10-CM | POA: Diagnosis present

## 2022-09-10 LAB — BASIC METABOLIC PANEL
Anion gap: 12 (ref 5–15)
BUN: 16 mg/dL (ref 6–20)
CO2: 21 mmol/L — ABNORMAL LOW (ref 22–32)
Calcium: 9 mg/dL (ref 8.9–10.3)
Chloride: 104 mmol/L (ref 98–111)
Creatinine, Ser: 0.98 mg/dL (ref 0.44–1.00)
GFR, Estimated: >60 mL/min (ref >60)
Glucose, Bld: 94 mg/dL (ref 70–99)
Potassium: 4.3 mmol/L (ref 3.5–5.1)
Sodium: 137 mmol/L (ref 135–145)

## 2022-09-10 LAB — CBC
HCT: 52.1 % — ABNORMAL HIGH (ref 36.0–46.0)
Hemoglobin: 17.1 g/dL — ABNORMAL HIGH (ref 12.0–15.0)
MCH: 31.7 pg (ref 26.0–34.0)
MCHC: 32.8 g/dL (ref 30.0–36.0)
MCV: 96.7 fL (ref 80.0–100.0)
Platelets: 254 10*3/uL (ref 150–400)
RBC: 5.39 MIL/uL — ABNORMAL HIGH (ref 3.87–5.11)
RDW: 12.9 % (ref 11.5–15.5)
WBC: 14.8 10*3/uL — ABNORMAL HIGH (ref 4.0–10.5)
nRBC: 0 % (ref 0.0–0.2)

## 2022-09-10 LAB — URINALYSIS, ROUTINE W REFLEX MICROSCOPIC
Bacteria, UA: NONE SEEN
Bilirubin Urine: NEGATIVE
Glucose, UA: NEGATIVE mg/dL
Ketones, ur: 20 mg/dL — AB
Leukocytes,Ua: NEGATIVE
Nitrite: NEGATIVE
Protein, ur: 30 mg/dL — AB
Specific Gravity, Urine: 1.016 (ref 1.005–1.030)
pH: 5 (ref 5.0–8.0)

## 2022-09-10 LAB — PREGNANCY, URINE: Preg Test, Ur: NEGATIVE

## 2022-09-10 MED ORDER — KETOROLAC TROMETHAMINE 60 MG/2ML IM SOLN: 60.0000 mg | Freq: Once | INTRAMUSCULAR | Status: DC

## 2022-09-10 MED ORDER — ONDANSETRON 4 MG PO TBDP: 4.0000 mg | ORAL_TABLET | Freq: Once | ORAL | Status: DC

## 2022-09-10 MED ORDER — ONDANSETRON HCL 4 MG/2ML IJ SOLN
4.0000 mg | Freq: Once | INTRAMUSCULAR | Status: AC
  Administered 2022-09-10: 4 mg via INTRAVENOUS
  Filled 2022-09-10: qty 2

## 2022-09-10 MED ORDER — SODIUM CHLORIDE 0.9 % IV BOLUS
1000.0000 mL | Freq: Once | INTRAVENOUS | Status: AC
  Administered 2022-09-10: 1000 mL via INTRAVENOUS

## 2022-09-10 MED ORDER — HYDROCODONE-ACETAMINOPHEN 5-325 MG PO TABS: 1.0000 | ORAL_TABLET | ORAL | 0 refills | Status: AC | PRN

## 2022-09-10 MED ORDER — HYDROMORPHONE HCL 1 MG/ML IJ SOLN: 1.0000 mg | Freq: Once | INTRAMUSCULAR | Status: DC

## 2022-09-10 MED ORDER — KETOROLAC TROMETHAMINE 30 MG/ML IJ SOLN
30.0000 mg | Freq: Once | INTRAMUSCULAR | Status: AC
  Administered 2022-09-10: 30 mg via INTRAVENOUS
  Filled 2022-09-10: qty 1

## 2022-09-10 NOTE — ED Provider Notes (Signed)
[facility] Provider Note    Event Date/Time   First MD Initiated Contact with Patient 09/10/22 1154     (approximate)  History   Chief Complaint: Flank Pain  HPI  Norma Riggs is a 47 y.o. female with a past medical history of depression, fibromyalgia, gastric reflux, hypertension, presents to the emergency department for flank pain.  According to the patient on Monday she began feeling like she was having a kidney stone with some left flank pain.  States she noted Tuesday or Wednesday that her urine appeared orange in color, denies taking AZO or other medications.  States her symptoms pretty much went away Wednesday and Thursday however they came back today with bilateral flank pain more on the left side.  Patient denies any dysuria or burning at any point.  No vaginal symptoms patient's status post hysterectomy.  Denies any changes with position.  Physical Exam   Triage Vital Signs: ED Triage Vitals  Enc Vitals Group     BP 09/10/22 1136 (!) 181/113     Pulse Rate 09/10/22 1136 (!) 126     Resp 09/10/22 1137 18     Temp 09/10/22 1136 98.9 F (37.2 C)     Temp Source 09/10/22 1136 Oral     SpO2 09/10/22 1136 96 %     Weight 09/10/22 1136 236 lb (107 kg)     Height 09/10/22 1136 '5\' 7"'$  (1.702 m)     Head Circumference --      Peak Flow --      Pain Score 09/10/22 1131 7     Pain Loc --      Pain Edu? --      Excl. in [place]? --     Most recent vital signs: Vitals:   09/10/22 1136 09/10/22 1137  BP: (!) 181/113   Pulse: (!) 126   Resp:  18  Temp: 98.9 F (37.2 C)   SpO2: 96%     General: Awake, no distress.  CV:  Good peripheral perfusion.  Regular rate and rhythm  Resp:  Normal effort.  Equal breath sounds bilaterally.  Abd:  No distention.  Soft, nontender.  No rebound or guarding.  Mild right CVA tenderness moderate to severe left CVA tenderness.   ED Results / Procedures / Treatments   RADIOLOGY  I have reviewed and  interpreted the CT images.  No obvious stone or obstruction seen on my evaluation. Radiology is read the CT scan as negative for acute abnormality   MEDICATIONS ORDERED IN ED: Medications  ketorolac (TORADOL) injection 60 mg (has no administration in time range)  ondansetron (ZOFRAN-ODT) disintegrating tablet 4 mg (has no administration in time range)     IMPRESSION / MDM / ASSESSMENT AND PLAN / ED COURSE  I reviewed the triage vital signs and the nursing notes.  Patient's presentation is most consistent with acute presentation with potential threat to life or bodily function.  Patient presents emergency department for intermittent left flank pain over the past 4 days.  Went away for the past 2 days but back again today and is moderate in severity.  Mostly in the left flank.  States she was seeing darker orange urine several days ago but that went away.  No dysuria or hematuria.  No fever.  Patient does have moderate left CVA tenderness on exam with no anterior abdominal tenderness.  Mild right CVA tenderness.  Patient's labs have resulted showing moderate leukocytosis chemistry shows no concerning findings.  Urinalysis shows small amount of ketones otherwise no red cells or white cells.  Given the ketones in the urine with a CBC suggestive of possible hemoconcentration we will dose IV fluids.  We will treat with Toradol and Zofran.  We obtain a CT scan to evaluate for ureterolithiasis colitis diverticulitis or other intra-abdominal pathology.  Patient states she is status post hysterectomy and oophorectomy cholecystectomy.  Still has her appendix but she has no right lower quadrant tenderness.  CT scan is essentially negative.  Urinalysis does not appear to show any concerning findings.  CBC and chemistry again no concerning findings besides mild white blood cell count elevation.  Suspect this could be more musculoskeletal pain or possibly related to the patient's underlying disease such as  fibromyalgia.  We will place the patient a short course of pain medication have the patient follow-up with her doctor.  FINAL CLINICAL IMPRESSION(S) / ED DIAGNOSES   Left flank pain   Note:  This document was prepared using Dragon voice recognition software and may include unintentional dictation errors.   Harvest Dark, MD 09/10/22 1500

## 2022-09-10 NOTE — ED Triage Notes (Signed)
Pt comes with c/o kidney pain for few days.

## 2022-09-11 LAB — URINE CULTURE: Culture: <10000 — AB

## 2023-08-10 ENCOUNTER — Inpatient Hospital Stay: Payer: Managed Care, Other (non HMO) | Attending: Oncology

## 2023-08-10 DIAGNOSIS — Z800 Family history of malignant neoplasm of digestive organs: Secondary | ICD-10-CM | POA: Diagnosis not present

## 2023-08-10 DIAGNOSIS — D509 Iron deficiency anemia, unspecified: Secondary | ICD-10-CM | POA: Diagnosis present

## 2023-08-10 DIAGNOSIS — E538 Deficiency of other specified B group vitamins: Secondary | ICD-10-CM | POA: Insufficient documentation

## 2023-08-10 DIAGNOSIS — Z87891 Personal history of nicotine dependence: Secondary | ICD-10-CM | POA: Insufficient documentation

## 2023-08-10 DIAGNOSIS — Z9884 Bariatric surgery status: Secondary | ICD-10-CM | POA: Diagnosis not present

## 2023-08-10 DIAGNOSIS — Z90710 Acquired absence of both cervix and uterus: Secondary | ICD-10-CM | POA: Diagnosis not present

## 2023-08-10 DIAGNOSIS — Z803 Family history of malignant neoplasm of breast: Secondary | ICD-10-CM | POA: Insufficient documentation

## 2023-08-10 DIAGNOSIS — D751 Secondary polycythemia: Secondary | ICD-10-CM | POA: Diagnosis not present

## 2023-08-10 DIAGNOSIS — G4733 Obstructive sleep apnea (adult) (pediatric): Secondary | ICD-10-CM | POA: Diagnosis not present

## 2023-08-10 DIAGNOSIS — D508 Other iron deficiency anemias: Secondary | ICD-10-CM

## 2023-08-10 LAB — CBC WITH DIFFERENTIAL/PLATELET
Abs Immature Granulocytes: 0.09 10*3/uL — ABNORMAL HIGH (ref 0.00–0.07)
Basophils Absolute: 0.1 10*3/uL (ref 0.0–0.1)
Basophils Relative: 1 %
Eosinophils Absolute: 0.4 10*3/uL (ref 0.0–0.5)
Eosinophils Relative: 3 %
HCT: 47.1 % — ABNORMAL HIGH (ref 36.0–46.0)
Hemoglobin: 15.3 g/dL — ABNORMAL HIGH (ref 12.0–15.0)
Immature Granulocytes: 1 %
Lymphocytes Relative: 26 %
Lymphs Abs: 3.1 10*3/uL (ref 0.7–4.0)
MCH: 31.5 pg (ref 26.0–34.0)
MCHC: 32.5 g/dL (ref 30.0–36.0)
MCV: 97.1 fL (ref 80.0–100.0)
Monocytes Absolute: 0.7 10*3/uL (ref 0.1–1.0)
Monocytes Relative: 6 %
Neutro Abs: 7.5 10*3/uL (ref 1.7–7.7)
Neutrophils Relative %: 63 %
Platelets: 275 10*3/uL (ref 150–400)
RBC: 4.85 MIL/uL (ref 3.87–5.11)
RDW: 11.9 % (ref 11.5–15.5)
WBC: 11.9 10*3/uL — ABNORMAL HIGH (ref 4.0–10.5)
nRBC: 0 % (ref 0.0–0.2)

## 2023-08-10 LAB — VITAMIN B12: Vitamin B-12: 145 pg/mL — ABNORMAL LOW (ref 180–914)

## 2023-08-10 LAB — COMPREHENSIVE METABOLIC PANEL
ALT: 23 U/L (ref 0–44)
AST: 20 U/L (ref 15–41)
Albumin: 4.1 g/dL (ref 3.5–5.0)
Alkaline Phosphatase: 53 U/L (ref 38–126)
Anion gap: 9 (ref 5–15)
BUN: 16 mg/dL (ref 6–20)
CO2: 26 mmol/L (ref 22–32)
Calcium: 9.9 mg/dL (ref 8.9–10.3)
Chloride: 103 mmol/L (ref 98–111)
Creatinine, Ser: 0.94 mg/dL (ref 0.44–1.00)
GFR, Estimated: >60 mL/min (ref >60)
Glucose, Bld: 91 mg/dL (ref 70–99)
Potassium: 3.9 mmol/L (ref 3.5–5.1)
Sodium: 138 mmol/L (ref 135–145)
Total Bilirubin: 0.6 mg/dL (ref <1.2)
Total Protein: 7.4 g/dL (ref 6.5–8.1)

## 2023-08-10 LAB — IRON AND TIBC
Iron: 111 ug/dL (ref 28–170)
Saturation Ratios: 22 % (ref 10.4–31.8)
TIBC: 510 ug/dL — ABNORMAL HIGH (ref 250–450)
UIBC: 399 ug/dL

## 2023-08-10 LAB — FERRITIN: Ferritin: 94 ng/mL (ref 11–307)

## 2023-08-10 LAB — FOLATE: Folate: 9.8 ng/mL (ref >5.9)

## 2023-08-17 ENCOUNTER — Inpatient Hospital Stay (HOSPITAL_BASED_OUTPATIENT_CLINIC_OR_DEPARTMENT_OTHER): Payer: Managed Care, Other (non HMO) | Admitting: Oncology

## 2023-08-17 ENCOUNTER — Encounter: Payer: Self-pay | Admitting: Oncology

## 2023-08-17 ENCOUNTER — Inpatient Hospital Stay: Payer: Managed Care, Other (non HMO)

## 2023-08-17 VITALS — BP 130/89 | HR 67 | Temp 96.5°F | Resp 18 | Wt 199.6 lb

## 2023-08-17 DIAGNOSIS — E538 Deficiency of other specified B group vitamins: Secondary | ICD-10-CM | POA: Diagnosis not present

## 2023-08-17 DIAGNOSIS — Z9884 Bariatric surgery status: Secondary | ICD-10-CM

## 2023-08-17 DIAGNOSIS — D508 Other iron deficiency anemias: Secondary | ICD-10-CM

## 2023-08-17 DIAGNOSIS — D751 Secondary polycythemia: Secondary | ICD-10-CM | POA: Diagnosis not present

## 2023-08-17 DIAGNOSIS — D509 Iron deficiency anemia, unspecified: Secondary | ICD-10-CM | POA: Diagnosis not present

## 2023-08-17 MED ORDER — B-12 2500 MCG SL SUBL: 1.0000 | SUBLINGUAL_TABLET | Freq: Every day | SUBLINGUAL | 5 refills | Status: AC

## 2023-08-17 NOTE — Assessment & Plan Note (Signed)
Mal-aborption could be the etiology of iron deficiency I recommend her follow up with gastroenterology

## 2023-08-17 NOTE — Assessment & Plan Note (Addendum)
Labs reviewed and discussed with patient. Lab Results  Component Value Date   HGB 15.3 (H) 08/10/2023   TIBC 510 (H) 08/10/2023   IRONPCTSAT 22 08/10/2023   FERRITIN 94 08/10/2023    No need for IV venofer.

## 2023-08-17 NOTE — Progress Notes (Signed)
Hematology/Oncology Progress note [phone] [phone]    ASSESSMENT & PLAN:   Iron deficiency anemia Labs reviewed and discussed with patient. Lab Results  Component Value Date   HGB 15.3 (H) 08/10/2023   TIBC 510 (H) 08/10/2023   IRONPCTSAT 22 08/10/2023   FERRITIN 94 08/10/2023    No need for IV venofer.    B12 deficiency B12 level is 182, recommend goal of Vitamin B12 >400.  I recommend monthly B12 IM injection. She prefers to get injections via PCP's office- not done.  Discussed alternative of sublingual B12 2500 mcg daily. Rx sent.   History of gastric bypass Mal-aborption could be the etiology of iron deficiency I recommend her follow up with gastroenterology   Erythrocytosis Likely secondary due to OSA, not currently on CPAP. Recommend her to follow up with PCP No need for phlebotomy  Orders Placed This Encounter  Procedures   CBC with Differential (Cancer Center Only)    Standing Status:   Future    Expected Date:   02/15/2024    Expiration Date:   08/16/2024   CMP (Cancer Center only)    Standing Status:   Future    Expected Date:   02/15/2024    Expiration Date:   08/16/2024   Iron and TIBC    Standing Status:   Future    Expected Date:   02/15/2024    Expiration Date:   08/16/2024   Ferritin    Standing Status:   Future    Expected Date:   02/15/2024    Expiration Date:   08/16/2024   Vitamin B12    Standing Status:   Future    Expected Date:   02/15/2024    Expiration Date:   08/16/2024   Follow up  [facility] lab in 6 months, prior to virtual MD  All questions were answered. The patient knows to call the clinic with any problems, questions or concerns.  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 She has been taking sublingual B12 since her surgery. She started taking oral iron once daily about 3 weeks ago. She had a total hysterectomy in 2012 due to endometriosis.  Labs on 11/11/2020 revealed a hematocrit of 39.2, hemoglobin 12.6, MCV 86.0, platelets 349,000, WBC 9,400. Ferritin was 5 with an iron saturation of 5% and a TIBC of 611. Vitamin B12 was 183 and folate 7.8.  Patient received IV Venofer treatments  Colonoscopy on 10/27/2018 revealed acute ileitis.  EGD on 02/20/2020 revealed a normal esophagus. Roux-en-Y gastrojejunostomy with gastrojejunal anastomosis characterized by healthy appearing mucosa. There was a normal examined jejunum. Pathology was unremarkable.  She has B12 deficiency.  B12 has been followed: 361 on 06/11/2016, 346 on 01/03/2017, 275 on 02/17/2018, 147 on 07/20/2019, and 183 on 11/11/2020.  Folate has ranged from 7.8 - 43.0. The patient has sleep apnea and is not using a CPAP machine. Colonoscopy on 10/27/2018 revealed acute ileitis.  Her father had colon polyps. Her mother had breast cancer and pancreatic cancer.   INTERVAL HISTORY Norma Riggs is a 47 y.o. female who has above history reviewed by me today presents for follow up visit for iron  deficiency anemia and vitamin B12 deficiency. She feels tired.  OSA not currently using CPAP.  She is not on B12 injections.   Past Medical History:  Diagnosis Date   Asthma    Depression    Dysrhythmia    tachycardia   Endometriosis    Fibromyalgia    GERD (gastroesophageal reflux disease)    High cholesterol    Hypertension    Migraines    NASH (nonalcoholic steatohepatitis)    Sleep apnea    on c-pap    Past Surgical History:  Procedure Laterality Date   BREAST BIOPSY Left 2009   neg   BREAST EXCISIONAL BIOPSY Left  2009   ESOPHAGOGASTRODUODENOSCOPY N/A 02/20/2020   Procedure: ESOPHAGOGASTRODUODENOSCOPY (EGD);  Surgeon: Toledo, Boykin Nearing, MD;  Location: [facility] ENDOSCOPY;  Service: Gastroenterology;  Laterality: N/A;   GASTRIC BYPASS     TOTAL ABDOMINAL HYSTERECTOMY  2012   endometriosis    Family History  Problem Relation Age of Onset   Diabetes Mother    Breast cancer Mother 61   CAD Brother 57    Social History:  reports that she quit smoking about 8 years ago. Her smoking use included cigarettes. She has never used smokeless tobacco. She reports that she does not drink alcohol and does not use drugs.  Allergies:  Allergies  Allergen Reactions   Gadolinium Derivatives Hives    Per pt she broke out in hives after receiving MRI contrast in 2010 (not CT contrast). MSY    Penicillins Nausea And Vomiting    Other reaction(s): Hypotension   Sulfa Antibiotics Itching    Current Medications: Current Outpatient Medications  Medication Sig Dispense Refill   ARIPiprazole (ABILIFY) 10 MG tablet Take 10 mg by mouth daily.     Cyanocobalamin (B-12) 2500 MCG SUBL Place 1 tablet under the tongue daily. 30 tablet 5   lithium 600 MG capsule Take 600 mg by mouth daily.     lithium carbonate 300 MG capsule SMARTSIG:1 Capsule(s) By Mouth Every Evening     naltrexone (DEPADE) 50 MG tablet Take by mouth.     pantoprazole (PROTONIX) 40 MG tablet Take 40 mg by mouth daily.     propranolol (INDERAL) 10 MG tablet Take 10 mg by mouth 2 (two) times daily.     traZODone (DESYREL) 100 MG tablet Take 1 tablet by mouth at bedtime.     venlafaxine XR (EFFEXOR-XR) 150 MG 24 hr capsule Take 1 capsule by mouth daily.     albuterol (VENTOLIN HFA) 108 (90 Base) MCG/ACT inhaler Inhale 1 puff into the lungs every 6 (six) hours as needed for wheezing or shortness of breath. (Patient not taking: Reported on 08/17/2023) 18 g 0   colestipol (COLESTID) 1 g tablet Take by mouth. (Patient not taking: Reported on 08/17/2023)      estradiol (VIVELLE-DOT) 0.1 MG/24HR patch Place 1 patch onto the skin 2 (two) times a week. (Patient not taking: Reported on 08/17/2023)     HYDROcodone-acetaminophen (NORCO/VICODIN) 5-325 MG tablet Take 1 tablet by mouth every 4 (four) hours as needed. (Patient not taking: Reported on 08/17/2023) 12 tablet 0   hydrOXYzine (ATARAX/VISTARIL) 25 MG tablet Take 25 mg by mouth 3 (three) times daily as needed. (Patient not taking: Reported on 08/17/2023)     Multiple Vitamins-Minerals (HAIR SKIN & NAILS ADVANCED PO) Take by mouth daily. (Patient not taking: Reported on 08/17/2023)     No current facility-administered medications for this visit.   Review of Systems  Constitutional:  Positive for fatigue. Negative for appetite change, chills and fever.  HENT:   Negative for hearing loss and voice change.   Eyes:  Negative for eye problems.  Respiratory:  Negative for chest tightness and cough.   Cardiovascular:  Negative for chest pain.  Gastrointestinal:  Negative for abdominal distention, abdominal pain, blood in stool and diarrhea.  Endocrine: Negative for hot flashes.  Genitourinary:  Negative for difficulty urinating and frequency.   Musculoskeletal:  Negative for arthralgias.       Fibromyalgia  Skin:  Negative for itching and rash.  Neurological:  Negative for extremity weakness.  Hematological:  Negative for adenopathy.  Psychiatric/Behavioral:  Negative for confusion.      Performance status (ECOG): 1  Vitals Blood pressure 130/89, pulse 67, temperature (!) 96.5 F (35.8 C), temperature source Tympanic, resp. rate 18, weight 199 lb 9.6 oz (90.5 kg).   Physical Exam Constitutional:      General: She is not in acute distress.    Appearance: Normal appearance.  HENT:     Nose: Nose normal.     Mouth/Throat:     Pharynx: No oropharyngeal exudate.  Eyes:     Pupils: Pupils are equal, round, and reactive to light.  Cardiovascular:     Rate and Rhythm: Normal rate and regular  rhythm.     Heart sounds: No murmur heard. Pulmonary:     Effort: Pulmonary effort is normal. No respiratory distress.     Breath sounds: Normal breath sounds.  Abdominal:     General: There is no distension.  Musculoskeletal:        General: Normal range of motion.     Cervical back: Normal range of motion and neck supple.  Skin:    General: Skin is warm and dry.  Neurological:     Mental Status: She is alert and oriented to person, place, and time. Mental status is at baseline.     Cranial Nerves: No cranial nerve deficit.     Motor: No abnormal muscle tone.  Psychiatric:        Mood and Affect: Mood and affect normal.    Labs are reviewed and discussed with patient.    Latest Ref Rng & Units 08/10/2023   11:18 AM 09/10/2022   11:41 AM 02/16/2022    1:00 PM  CBC  WBC 4.0 - 10.5 K/uL 11.9  14.8  10.8   Hemoglobin 12.0 - 15.0 g/dL 16.1  09.6  04.5   Hematocrit 36.0 - 46.0 % 47.1  52.1  45.1   Platelets 150 - 400 K/uL 275  254  332       Latest Ref Rng & Units 08/10/2023   11:18 AM 09/10/2022   11:41 AM 08/19/2021   12:59 PM  CMP  Glucose 70 - 99 mg/dL 91  94  89   BUN 6 - 20 mg/dL 16  16  13    Creatinine 0.44 - 1.00 mg/dL 4.09  8.11  9.14   Sodium 135 - 145 mmol/L 138  137  136   Potassium 3.5 - 5.1 mmol/L 3.9  4.3  4.1   Chloride 98 - 111 mmol/L 103  104  100   CO2 22 - 32 mmol/L 26  21  26    Calcium 8.9 - 10.3 mg/dL 9.9  9.0  9.3   Total Protein 6.5 - 8.1 g/dL 7.4   7.4   Total Bilirubin <1.2 mg/dL 0.6   0.2   Alkaline Phos 38 - 126 U/L  53   104   AST 15 - 41 U/L 20   23   ALT 0 - 44 U/L 23   19    Iron/TIBC/Ferritin/ %Sat    Component Value Date/Time   IRON 111 08/10/2023 1118   TIBC 510 (H) 08/10/2023 1118   FERRITIN 94 08/10/2023 1118   IRONPCTSAT 22 08/10/2023 1118

## 2023-08-17 NOTE — Assessment & Plan Note (Signed)
Likely secondary due to OSA, not currently on CPAP. Recommend her to follow up with PCP No need for phlebotomy

## 2023-08-17 NOTE — Assessment & Plan Note (Addendum)
B12 level is 182, recommend goal of Vitamin B12 >400.  I recommend monthly B12 IM injection. She prefers to get injections via PCP's office- not done.  Discussed alternative of sublingual B12 2500 mcg daily. Rx sent.

## 2023-08-17 NOTE — Progress Notes (Signed)
No Venofer today per MD 

## 2023-08-19 ENCOUNTER — Other Ambulatory Visit: Payer: Managed Care, Other (non HMO)

## 2023-08-25 ENCOUNTER — Ambulatory Visit: Payer: Managed Care, Other (non HMO) | Admitting: Oncology

## 2023-08-25 ENCOUNTER — Ambulatory Visit: Payer: Managed Care, Other (non HMO)

## 2024-02-02 ENCOUNTER — Encounter: Payer: Self-pay | Admitting: Oncology

## 2024-02-22 ENCOUNTER — Inpatient Hospital Stay: Payer: Managed Care, Other (non HMO) | Admitting: Oncology
